# Patient Record
Sex: Male | Born: 1940 | Race: White | Hispanic: No | Marital: Married | Smoking: Never smoker
Health system: Southern US, Community
[De-identification: ages and names within clinical notes are randomized; demographics above are authoritative.]

## PROBLEM LIST (undated history)

## (undated) DIAGNOSIS — I10 Essential (primary) hypertension: Secondary | ICD-10-CM

## (undated) DIAGNOSIS — E669 Obesity, unspecified: Secondary | ICD-10-CM

## (undated) DIAGNOSIS — I2589 Other forms of chronic ischemic heart disease: Secondary | ICD-10-CM

## (undated) DIAGNOSIS — E876 Hypokalemia: Secondary | ICD-10-CM

## (undated) DIAGNOSIS — Z955 Presence of coronary angioplasty implant and graft: Secondary | ICD-10-CM

## (undated) DIAGNOSIS — E119 Type 2 diabetes mellitus without complications: Secondary | ICD-10-CM

## (undated) DIAGNOSIS — I509 Heart failure, unspecified: Secondary | ICD-10-CM

## (undated) DIAGNOSIS — I639 Cerebral infarction, unspecified: Secondary | ICD-10-CM

## (undated) HISTORY — DX: Type 2 diabetes mellitus without complications: E11.9

## (undated) HISTORY — DX: Cerebral infarction, unspecified: I63.9

## (undated) HISTORY — DX: Essential (primary) hypertension: I10

## (undated) HISTORY — DX: Hypokalemia: E87.6

## (undated) HISTORY — DX: Other forms of chronic ischemic heart disease: I25.89

## (undated) HISTORY — DX: Obesity, unspecified: E66.9

## (undated) HISTORY — DX: Presence of coronary angioplasty implant and graft: Z95.5

---

## 1981-04-22 HISTORY — PX: UMBILICAL HERNIA REPAIR: SHX196

## 2008-10-19 ENCOUNTER — Ambulatory Visit: Payer: Self-pay | Admitting: Internal Medicine

## 2008-10-19 ENCOUNTER — Ambulatory Visit: Payer: Self-pay | Admitting: Cardiology

## 2008-10-19 ENCOUNTER — Inpatient Hospital Stay (HOSPITAL_COMMUNITY): Admission: EM | Admit: 2008-10-19 | Discharge: 2008-10-29 | Payer: Self-pay | Admitting: Emergency Medicine

## 2008-10-19 ENCOUNTER — Encounter: Payer: Self-pay | Admitting: Emergency Medicine

## 2008-10-20 ENCOUNTER — Encounter: Payer: Self-pay | Admitting: Cardiology

## 2008-10-21 ENCOUNTER — Ambulatory Visit: Payer: Self-pay | Admitting: Vascular Surgery

## 2008-10-21 ENCOUNTER — Encounter: Payer: Self-pay | Admitting: Internal Medicine

## 2008-10-21 ENCOUNTER — Encounter: Payer: Self-pay | Admitting: Cardiothoracic Surgery

## 2008-10-22 ENCOUNTER — Ambulatory Visit: Payer: Self-pay | Admitting: Cardiothoracic Surgery

## 2008-10-25 ENCOUNTER — Encounter: Payer: Self-pay | Admitting: Internal Medicine

## 2008-10-26 ENCOUNTER — Encounter: Payer: Self-pay | Admitting: Internal Medicine

## 2008-10-27 ENCOUNTER — Encounter: Payer: Self-pay | Admitting: Cardiology

## 2008-11-01 ENCOUNTER — Ambulatory Visit: Payer: Self-pay | Admitting: Cardiology

## 2008-11-01 ENCOUNTER — Ambulatory Visit: Payer: Self-pay | Admitting: Internal Medicine

## 2008-11-01 ENCOUNTER — Encounter (INDEPENDENT_AMBULATORY_CARE_PROVIDER_SITE_OTHER): Payer: Self-pay | Admitting: Cardiology

## 2008-11-01 DIAGNOSIS — E876 Hypokalemia: Secondary | ICD-10-CM

## 2008-11-03 LAB — CONVERTED CEMR LAB
CO2: 28 meq/L (ref 19–32)
Calcium: 9 mg/dL (ref 8.4–10.5)
Glucose, Bld: 146 mg/dL — ABNORMAL HIGH (ref 70–99)
Potassium: 4.1 meq/L (ref 3.5–5.1)
Sodium: 135 meq/L (ref 135–145)

## 2008-11-07 ENCOUNTER — Encounter: Admission: RE | Admit: 2008-11-07 | Discharge: 2009-02-05 | Payer: Self-pay | Admitting: Internal Medicine

## 2008-11-07 ENCOUNTER — Encounter: Payer: Self-pay | Admitting: Internal Medicine

## 2008-11-07 ENCOUNTER — Ambulatory Visit: Payer: Self-pay | Admitting: Cardiology

## 2008-11-12 DIAGNOSIS — E669 Obesity, unspecified: Secondary | ICD-10-CM

## 2008-11-12 DIAGNOSIS — E119 Type 2 diabetes mellitus without complications: Secondary | ICD-10-CM

## 2008-11-12 DIAGNOSIS — I2589 Other forms of chronic ischemic heart disease: Secondary | ICD-10-CM | POA: Insufficient documentation

## 2008-11-12 DIAGNOSIS — Z8679 Personal history of other diseases of the circulatory system: Secondary | ICD-10-CM

## 2008-11-15 ENCOUNTER — Encounter: Payer: Self-pay | Admitting: Internal Medicine

## 2008-11-16 ENCOUNTER — Ambulatory Visit: Payer: Self-pay | Admitting: Cardiology

## 2008-11-16 ENCOUNTER — Encounter: Payer: Self-pay | Admitting: Physician Assistant

## 2008-11-16 DIAGNOSIS — I6350 Cerebral infarction due to unspecified occlusion or stenosis of unspecified cerebral artery: Secondary | ICD-10-CM | POA: Insufficient documentation

## 2008-11-16 DIAGNOSIS — I2510 Atherosclerotic heart disease of native coronary artery without angina pectoris: Secondary | ICD-10-CM | POA: Insufficient documentation

## 2008-11-16 LAB — CONVERTED CEMR LAB
POC INR: 2.6
Prothrombin Time: 19.5 s

## 2008-11-23 ENCOUNTER — Ambulatory Visit: Payer: Self-pay | Admitting: Cardiology

## 2008-11-23 ENCOUNTER — Ambulatory Visit: Payer: Self-pay

## 2008-11-23 ENCOUNTER — Encounter: Payer: Self-pay | Admitting: Internal Medicine

## 2008-11-23 LAB — CONVERTED CEMR LAB: POC INR: 2.8

## 2008-11-30 ENCOUNTER — Encounter: Payer: Self-pay | Admitting: Internal Medicine

## 2008-12-02 ENCOUNTER — Ambulatory Visit: Payer: Self-pay | Admitting: Internal Medicine

## 2008-12-02 LAB — CONVERTED CEMR LAB: POC INR: 2.3

## 2008-12-07 ENCOUNTER — Ambulatory Visit: Payer: Self-pay | Admitting: Internal Medicine

## 2008-12-07 DIAGNOSIS — M109 Gout, unspecified: Secondary | ICD-10-CM | POA: Insufficient documentation

## 2008-12-07 DIAGNOSIS — I5022 Chronic systolic (congestive) heart failure: Secondary | ICD-10-CM | POA: Insufficient documentation

## 2008-12-29 ENCOUNTER — Ambulatory Visit: Payer: Self-pay | Admitting: Internal Medicine

## 2008-12-29 ENCOUNTER — Ambulatory Visit: Payer: Self-pay | Admitting: Cardiovascular Disease

## 2009-01-12 ENCOUNTER — Ambulatory Visit: Payer: Self-pay | Admitting: Internal Medicine

## 2009-01-26 ENCOUNTER — Ambulatory Visit: Payer: Self-pay | Admitting: Internal Medicine

## 2009-02-09 ENCOUNTER — Telehealth: Payer: Self-pay | Admitting: Internal Medicine

## 2009-02-09 ENCOUNTER — Ambulatory Visit: Payer: Self-pay | Admitting: Cardiovascular Disease

## 2009-02-21 ENCOUNTER — Ambulatory Visit: Payer: Self-pay | Admitting: Internal Medicine

## 2009-02-21 DIAGNOSIS — E785 Hyperlipidemia, unspecified: Secondary | ICD-10-CM

## 2009-02-21 DIAGNOSIS — I238 Other current complications following acute myocardial infarction: Secondary | ICD-10-CM

## 2009-03-07 ENCOUNTER — Ambulatory Visit: Payer: Self-pay | Admitting: Cardiology

## 2009-03-08 ENCOUNTER — Encounter: Payer: Self-pay | Admitting: Internal Medicine

## 2009-03-17 ENCOUNTER — Ambulatory Visit: Payer: Self-pay | Admitting: Internal Medicine

## 2009-03-17 LAB — CONVERTED CEMR LAB: POC INR: 2.5

## 2009-04-03 ENCOUNTER — Ambulatory Visit: Payer: Self-pay | Admitting: Cardiovascular Disease

## 2009-04-03 LAB — CONVERTED CEMR LAB: POC INR: 2.2

## 2009-05-01 ENCOUNTER — Ambulatory Visit: Payer: Self-pay | Admitting: Cardiology

## 2009-05-01 LAB — CONVERTED CEMR LAB: POC INR: 2

## 2009-05-29 ENCOUNTER — Ambulatory Visit: Payer: Self-pay | Admitting: Cardiology

## 2009-05-29 ENCOUNTER — Telehealth: Payer: Self-pay | Admitting: Internal Medicine

## 2009-05-29 LAB — CONVERTED CEMR LAB: POC INR: 1.8

## 2009-06-19 ENCOUNTER — Ambulatory Visit: Payer: Self-pay | Admitting: Internal Medicine

## 2009-06-19 LAB — CONVERTED CEMR LAB: POC INR: 2.3

## 2009-07-03 ENCOUNTER — Encounter: Payer: Self-pay | Admitting: Internal Medicine

## 2009-07-11 ENCOUNTER — Ambulatory Visit: Payer: Self-pay | Admitting: Internal Medicine

## 2009-07-11 ENCOUNTER — Ambulatory Visit: Payer: Self-pay | Admitting: Cardiovascular Disease

## 2009-07-24 ENCOUNTER — Telehealth: Payer: Self-pay | Admitting: Internal Medicine

## 2009-08-09 ENCOUNTER — Ambulatory Visit: Payer: Self-pay | Admitting: Internal Medicine

## 2009-08-09 ENCOUNTER — Ambulatory Visit: Payer: Self-pay | Admitting: Cardiology

## 2009-08-11 LAB — CONVERTED CEMR LAB
BUN: 14 mg/dL (ref 6–23)
Calcium: 8.7 mg/dL (ref 8.4–10.5)
GFR calc non Af Amer: 71.07 mL/min (ref >60)
Glucose, Bld: 108 mg/dL — ABNORMAL HIGH (ref 70–99)

## 2009-09-06 ENCOUNTER — Ambulatory Visit: Payer: Self-pay | Admitting: Cardiology

## 2009-10-04 ENCOUNTER — Ambulatory Visit: Payer: Self-pay | Admitting: Cardiology

## 2009-10-18 ENCOUNTER — Ambulatory Visit: Payer: Self-pay | Admitting: Internal Medicine

## 2009-11-08 ENCOUNTER — Ambulatory Visit: Payer: Self-pay | Admitting: Cardiology

## 2009-12-06 ENCOUNTER — Ambulatory Visit: Payer: Self-pay | Admitting: Cardiovascular Disease

## 2009-12-26 ENCOUNTER — Ambulatory Visit: Payer: Self-pay | Admitting: Internal Medicine

## 2010-01-19 ENCOUNTER — Encounter: Payer: Self-pay | Admitting: Internal Medicine

## 2010-01-23 ENCOUNTER — Ambulatory Visit: Payer: Self-pay | Admitting: Cardiology

## 2010-01-23 LAB — CONVERTED CEMR LAB: POC INR: 2.5

## 2010-02-20 ENCOUNTER — Encounter: Payer: Self-pay | Admitting: Cardiovascular Disease

## 2010-02-20 LAB — CONVERTED CEMR LAB: POC INR: 2.7

## 2010-03-19 ENCOUNTER — Ambulatory Visit: Payer: Self-pay | Admitting: Internal Medicine

## 2010-03-21 ENCOUNTER — Telehealth: Payer: Self-pay | Admitting: Internal Medicine

## 2010-04-11 ENCOUNTER — Telehealth: Payer: Self-pay | Admitting: Internal Medicine

## 2010-04-24 ENCOUNTER — Ambulatory Visit: Admission: RE | Admit: 2010-04-24 | Discharge: 2010-04-24 | Payer: Self-pay | Source: Home / Self Care

## 2010-05-22 ENCOUNTER — Ambulatory Visit: Admission: RE | Admit: 2010-05-22 | Discharge: 2010-05-22 | Payer: Self-pay | Source: Home / Self Care

## 2010-05-22 LAB — CONVERTED CEMR LAB: POC INR: 2.9

## 2010-05-22 NOTE — Progress Notes (Signed)
Summary: refill -- lipitor  Phone Note Refill Request Message from:  Patient on July 24, 2009 11:19 AM  Refills Requested: Medication #1:  LIPITOR 40 MG TABS Take one tablet by mouth daily. Send to [facility] not [place] need a 90 supply  Initial call taken by: Judie Grieve,  July 24, 2009 11:21 AM    Prescriptions: LIPITOR 40 MG TABS (ATORVASTATIN CALCIUM) Take one tablet by mouth daily.  #90 x 2   Entered by:   Hardin Negus, RMA   Authorized by:   Dolores Patty, MD, [facility]   Signed by:   Hardin Negus, RMA on 07/24/2009   Method used:   Electronically to        [facility]* (mail-order)             ,          Ph: [phone]       Fax: [phone]   RxID:   [id number]

## 2010-05-22 NOTE — Medication Information (Signed)
Summary: rov/mw  Anticoagulant Therapy  Managed by: Weston Brass, PharmD Referring MD: Gala Romney MD, Reuel Boom PCP: Windle Guard Supervising MD: Shirlee Latch MD, Dalton Indication 1: Cerebrovascular Accident Lab Used: [facility] [place] Site: [address] INR POC 2.5 INR RANGE 2-3  Dietary changes: no    Health status changes: no    Bleeding/hemorrhagic complications: no    Recent/future hospitalizations: no    Any changes in medication regimen? no    Recent/future dental: no  Any missed doses?: no       Is patient compliant with meds? yes       Allergies: 1)  ! * No Latex Allergy 2)  ! * Ivp Dye 3)  ! * No Shellfish Allergy  Anticoagulation Management History:      The patient is taking warfarin and comes in today for a routine follow up visit.  Positive risk factors for bleeding include an age of 34 years or older, history of CVA/TIA, and presence of serious comorbidities.  The bleeding index is 'high risk'.  Positive CHADS2 values include History of CHF, History of Diabetes, and Prior Stroke/CVA/TIA.  Negative CHADS2 values include Age > 21 years old.  Anticoagulation responsible provider: Shirlee Latch MD, Dalton.  INR POC: 2.5.  Cuvette Lot#: [id number].  Exp: 02/2011.    Anticoagulation Management Assessment/Plan:      The patient's current anticoagulation dose is Warfarin sodium 5 mg tabs: Use as directed by Anticoagulation Clinic.  The target INR is 2.0-3.0.  The next INR is due 02/20/2010.  Anticoagulation instructions were given to patient.  Results were reviewed/authorized by Weston Brass, PharmD.  He was notified by Ilean Skill D candidate.         Prior Anticoagulation Instructions: INR is 2.5 Continue taking 1.5 tablets everyday. See you in 4 weeks.  Current Anticoagulation Instructions: INR 2.5  Patient will continue current regimen of 1 1/2 tablets everyday.  Recheck in 4 weeks.

## 2010-05-22 NOTE — Medication Information (Signed)
Summary: rov/mw  Anticoagulant Therapy  Managed by: Weston Brass, PharmD Referring MD: Gala Romney MD, Reuel Boom PCP: Windle Guard Supervising MD: Tenny Craw MD, Gunnar Fusi Indication 1: Cerebrovascular Accident Lab Used: [facility] [place] Site: [address] INR POC 2.5 INR RANGE 2-3  Dietary changes: no    Health status changes: no    Bleeding/hemorrhagic complications: no    Recent/future hospitalizations: no    Any changes in medication regimen? no    Recent/future dental: no  Any missed doses?: no       Is patient compliant with meds? yes       Allergies: 1)  ! * No Latex Allergy 2)  ! * Ivp Dye 3)  ! * No Shellfish Allergy  Anticoagulation Management History:      The patient is taking warfarin and comes in today for a routine follow up visit.  Positive risk factors for bleeding include an age of 65 years or older, history of CVA/TIA, and presence of serious comorbidities.  The bleeding index is 'high risk'.  Positive CHADS2 values include History of CHF, History of Diabetes, and Prior Stroke/CVA/TIA.  Negative CHADS2 values include Age > 8 years old.  Anticoagulation responsible provider: Tenny Craw MD, Gunnar Fusi.  INR POC: 2.5.  Cuvette Lot#: [id number].  Exp: 03/2011.    Anticoagulation Management Assessment/Plan:      The patient's current anticoagulation dose is Warfarin sodium 5 mg tabs: Use as directed by Anticoagulation Clinic.  The target INR is 2.0-3.0.  The next INR is due 04/24/2010.  Anticoagulation instructions were given to patient.  Results were reviewed/authorized by Weston Brass, PharmD.  He was notified by Weston Brass PharmD.         Prior Anticoagulation Instructions: INR 2.7 Continue taking 1.5 tablets everyday. Recheck in 4 weeks.  Current Anticoagulation Instructions: INR 2.5  Continue same dose of 1 1/2 tablets every day.  Recheck INR in 4 weeks.

## 2010-05-22 NOTE — Progress Notes (Signed)
Summary: question re med  Phone Note Call from Patient   Caller: Patient  [phone] Reason for Call: Talk to Nurse Summary of Call: pt has question re medication Initial call taken by: Glynda Jaeger,  March 21, 2010 11:11 AM  Follow-up for Phone Call        pt states he received letter about generic lipitor and request to switch, will send new rx for generic Lipitor Meredith Staggers, RN  March 21, 2010 2:00 PM     New/Updated Medications: LIPITOR 40 MG TABS (ATORVASTATIN CALCIUM) Take 1 tablet by mouth once a day Prescriptions: LIPITOR 40 MG TABS (ATORVASTATIN CALCIUM) Take 1 tablet by mouth once a day  #90 x 3   Entered by:   Meredith Staggers, RN   Authorized by:   Dolores Patty, MD, [facility]   Signed by:   Meredith Staggers, RN on 03/21/2010   Method used:   Electronically to        [facility] (retail)             ,          Ph: [phone]       Fax: [phone]   RxID:   [id number]

## 2010-05-22 NOTE — Medication Information (Signed)
Summary: rov/tm  Anticoagulant Therapy  Managed by: Bethena Midget, RN, BSN Referring MD: Gala Romney MD, Reuel Boom PCP: Windle Guard Supervising MD: Eden Emms MD, Theron Arista Indication 1: Cerebrovascular Accident Lab Used: [facility] Site: [address] INR POC 2.4 INR RANGE 2-3  Dietary changes: no    Health status changes: no    Bleeding/hemorrhagic complications: no    Recent/future hospitalizations: no    Any changes in medication regimen? no    Recent/future dental: no  Any missed doses?: no       Is patient compliant with meds? yes       Current Medications (verified): 1)  Aspirin 81 Mg Tbec (Aspirin) .... Take One Tablet By Mouth Daily 2)  Carvedilol 6.25 Mg Tabs (Carvedilol) .... Take One Tablet By Mouth Twice A Day 3)  Enalapril Maleate 10 Mg Tabs (Enalapril Maleate) .... Take One Tablet By Mouth Twice A Day 4)  Furosemide 20 Mg Tabs (Furosemide) .... Take One Tablet By Mouth Every Other Day. 5)  Spironolactone 25 Mg Tabs (Spironolactone) .... Take 1  Tablet By Mouth Daily 6)  Nitroglycerin 0.4 Mg Subl (Nitroglycerin) .... One Tablet Under Tongue Every 5 Minutes As Needed For Chest Pain---May Repeat Times Three 7)  Warfarin Sodium 5 Mg Tabs (Warfarin Sodium) .... Use As Directed By Anticoagulation Clinic 8)  Metformin Hcl 500 Mg Tabs (Metformin Hcl) .... Take One Tablet By Mouth Once Daily. 9)  Allopurinol 100 Mg Tabs (Allopurinol) .... Take 1 Tablet By Mouth Once A Day 10)  Lipitor 40 Mg Tabs (Atorvastatin Calcium) .... Take One Tablet By Mouth Daily. 11)  Colcrys 0.6 Mg Tabs (Colchicine) .Marland Kitchen.. 1-2 Daily As Needed 12)  Fish Oil 1000 Mg Caps (Omega-3 Fatty Acids) .... 2 Caps Daily  Allergies: 1)  ! * No Latex Allergy 2)  ! * Ivp Dye 3)  ! * No Shellfish Allergy  Anticoagulation Management History:      The patient is taking warfarin and comes in today for a routine follow up visit.  Positive risk factors for bleeding include an age of 68 years or  older, history of CVA/TIA, and presence of serious comorbidities.  The bleeding index is 'high risk'.  Positive CHADS2 values include History of CHF, History of Diabetes, and Prior Stroke/CVA/TIA.  Negative CHADS2 values include Age > 54 years old.  Anticoagulation responsible provider: Eden Emms MD, Theron Arista.  INR POC: 2.4.  Cuvette Lot#: [id number].  Exp: 01/2011.    Anticoagulation Management Assessment/Plan:      The patient's current anticoagulation dose is Warfarin sodium 5 mg tabs: Use as directed by Anticoagulation Clinic.  The target INR is 2.0-3.0.  The next INR is due 01/03/2010.  Anticoagulation instructions were given to patient.  Results were reviewed/authorized by Bethena Midget, RN, BSN.  He was notified by Bethena Midget, RN, BSN.         Prior Anticoagulation Instructions: INR 2.3 Continue 7.5mg s everyday. Recheck in 4 weeks.   Current Anticoagulation Instructions: INR 2.4 Continue 7.5mg s everyday. Recheck in 4 weeks.
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
## 2010-05-22 NOTE — Medication Information (Signed)
Summary: rov/tm  Anticoagulant Therapy  Managed by: Bethena Midget, RN, BSN Referring MD: Gala Romney MD, Reuel Boom PCP: Windle Guard Supervising MD: Bensimhon MD,Daniel Indication 1: Cerebrovascular Accident Lab Used: [facility] [place] Site: [address] INR POC 2.5 INR RANGE 2-3  Dietary changes: no    Health status changes: no    Bleeding/hemorrhagic complications: no    Recent/future hospitalizations: no    Any changes in medication regimen? yes       Details: stopped taking plavix end of july  Recent/future dental: no  Any missed doses?: no       Is patient compliant with meds? yes       Allergies: 1)  ! * No Latex Allergy 2)  ! * Ivp Dye 3)  ! * No Shellfish Allergy  Anticoagulation Management History:      The patient is taking warfarin and comes in today for a routine follow up visit.  Positive risk factors for bleeding include an age of 68 years or older, history of CVA/TIA, and presence of serious comorbidities.  The bleeding index is 'high risk'.  Positive CHADS2 values include History of CHF, History of Diabetes, and Prior Stroke/CVA/TIA.  Negative CHADS2 values include Age > 42 years old.  Anticoagulation responsible provider: Bensimhon MD,Daniel.  INR POC: 2.5.  Cuvette Lot#: [id number].  Exp: 01/2011.    Anticoagulation Management Assessment/Plan:      The patient's current anticoagulation dose is Warfarin sodium 5 mg tabs: Use as directed by Anticoagulation Clinic.  The target INR is 2.0-3.0.  The next INR is due 01/23/2010.  Anticoagulation instructions were given to patient.  Results were reviewed/authorized by Bethena Midget, RN, BSN.         Prior Anticoagulation Instructions: INR 2.4 Continue 7.5mg s everyday. Recheck in 4 weeks.   Current Anticoagulation Instructions: INR is 2.5 Continue taking 1.5 tablets everyday. See you in 4 weeks.

## 2010-05-22 NOTE — Medication Information (Signed)
Summary: rov/eac  Anticoagulant Therapy  Managed by: Elaina Pattee, PharmD PCP: Windle Guard Supervising MD: Myrtis Ser MD, Tinnie Gens Indication 1: Cerebrovascular Accident Lab Used: [facility]  Site: [address] INR POC 1.9 INR RANGE 2-3  Dietary changes: no    Health status changes: no    Bleeding/hemorrhagic complications: no    Recent/future hospitalizations: no    Any changes in medication regimen? no    Recent/future dental: no  Any missed doses?: no       Is patient compliant with meds? yes       Allergies: 1)  ! * No Latex Allergy 2)  ! * Ivp Dye 3)  ! * No Shellfish Allergy  Anticoagulation Management History:      The patient is taking warfarin and comes in today for a routine follow up visit.  Positive risk factors for bleeding include an age of 68 years or older, history of CVA/TIA, and presence of serious comorbidities.  The bleeding index is 'high risk'.  Positive CHADS2 values include History of CHF, History of Diabetes, and Prior Stroke/CVA/TIA.  Negative CHADS2 values include Age > 68 years old.  Anticoagulation responsible provider: Myrtis Ser MD, Tinnie Gens.  INR POC: 1.9.  Cuvette Lot#: [id number].  Exp: 11/2010.    Anticoagulation Management Assessment/Plan:      The patient's current anticoagulation dose is Warfarin sodium 5 mg tabs: Use as directed by Anticoagulation Clinic.  The target INR is 2.0-3.0.  The next INR is due 10/18/2009.  Anticoagulation instructions were given to patient.  Results were reviewed/authorized by Elaina Pattee, PharmD.  He was notified by Elaina Pattee, PharmD.         Prior Anticoagulation Instructions: INR 2.2  Continue taking 1 tablet on Friday and 1.5 all other days.  Return to clinic in 4 weeks.    Current Anticoagulation Instructions: INR 1.9. Take 2 tablets today, then take 1.5 tablets daily.  Recheck in 2 weeks.

## 2010-05-22 NOTE — Medication Information (Signed)
Summary: Marcus Ramos  Anticoagulant Therapy  Managed by: Cloyde Reams, RN, BSN PCP: Windle Guard Supervising MD: Jens Som MD, Arlys John Indication 1: Cerebrovascular Accident Lab Used: [facility] [place] Site: [address] INR POC 1.8 INR RANGE 2-3  Dietary changes: yes       Details: Incr intake vit K, salads  Health status changes: no    Bleeding/hemorrhagic complications: no    Recent/future hospitalizations: no    Any changes in medication regimen? no    Recent/future dental: no  Any missed doses?: no       Is patient compliant with meds? yes       Allergies: 1)  ! * No Latex Allergy 2)  ! * Ivp Dye 3)  ! * No Shellfish Allergy  Anticoagulation Management History:      The patient is taking warfarin and comes in today for a routine follow up visit.  Positive risk factors for bleeding include an age of 51 years or older, history of CVA/TIA, and presence of serious comorbidities.  The bleeding index is 'high risk'.  Positive CHADS2 values include History of CHF, History of Diabetes, and Prior Stroke/CVA/TIA.  Negative CHADS2 values include Age > 77 years old.  Anticoagulation responsible provider: Jens Som MD, Arlys John.  INR POC: 1.8.  Cuvette Lot#: [id number].  Exp: 07/2010.    Anticoagulation Management Assessment/Plan:      The patient's current anticoagulation dose is Warfarin sodium 5 mg tabs: Use as directed by Anticoagulation Clinic.  The target INR is 2.0-3.0.  The next INR is due 06/19/2009.  Anticoagulation instructions were given to patient.  Results were reviewed/authorized by Cloyde Reams, RN, BSN.  He was notified by Cloyde Reams RN.         Prior Anticoagulation Instructions: INR 2.0  Take 1.5 tablet today then resume same dosage 1.5 tablets daily except 1 tablet on Mondays and Fridays.   Recheck in 4 weeks.    Current Anticoagulation Instructions: INR 1.8  Start taking 1.5 tablets daily except 1 on Fridays.  Recheck in 3 weeks.

## 2010-05-22 NOTE — Medication Information (Signed)
Summary: rov/cb  Anticoagulant Therapy  Managed by: Eda Keys, PharmD PCP: Windle Guard Supervising MD: Jens Som MD, Arlys John Indication 1: Cerebrovascular Accident Lab Used: [facility] [place] Site: [address] INR POC 2.2 INR RANGE 2-3  Dietary changes: no    Health status changes: no    Bleeding/hemorrhagic complications: no    Recent/future hospitalizations: no    Any changes in medication regimen? no    Recent/future dental: no  Any missed doses?: no       Is patient compliant with meds? yes       Allergies: 1)  ! * No Latex Allergy 2)  ! * Ivp Dye 3)  ! * No Shellfish Allergy  Anticoagulation Management History:      The patient is taking warfarin and comes in today for a routine follow up visit.  Positive risk factors for bleeding include an age of 68 years or older, history of CVA/TIA, and presence of serious comorbidities.  The bleeding index is 'high risk'.  Positive CHADS2 values include History of CHF, History of Diabetes, and Prior Stroke/CVA/TIA.  Negative CHADS2 values include Age > 68 years old.  Anticoagulation responsible provider: Jens Som MD, Arlys John.  INR POC: 2.2.  Cuvette Lot#: [id number].  Exp: 11/2010.    Anticoagulation Management Assessment/Plan:      The patient's current anticoagulation dose is Warfarin sodium 5 mg tabs: Use as directed by Anticoagulation Clinic.  The target INR is 2.0-3.0.  The next INR is due 10/04/2009.  Anticoagulation instructions were given to patient.  Results were reviewed/authorized by Eda Keys, PharmD.  He was notified by Eda Keys.         Prior Anticoagulation Instructions: INR 1.8. Take 2 tablets today, then take 1.5 tablets daily except 1 tablet on Fridays.  Current Anticoagulation Instructions: INR 2.2  Continue taking 1 tablet on Friday and 1.5 all other days.  Return to clinic in 4 weeks.

## 2010-05-22 NOTE — Medication Information (Signed)
Summary: rov/tm  Anticoagulant Therapy  Managed by: Cloyde Reams, RN, BSN PCP: Windle Guard Supervising MD: Jens Som MD, Arlys John Indication 1: Cerebrovascular Accident Lab Used: [facility] [place] Site: [address] INR POC 2.0 INR RANGE 2-3  Dietary changes: no    Health status changes: no    Bleeding/hemorrhagic complications: no    Recent/future hospitalizations: no    Any changes in medication regimen? no    Recent/future dental: no  Any missed doses?: no       Is patient compliant with meds? yes       Allergies (verified): 1)  ! * No Latex Allergy 2)  ! * Ivp Dye 3)  ! * No Shellfish Allergy  Anticoagulation Management History:      The patient is taking warfarin and comes in today for a routine follow up visit.  Positive risk factors for bleeding include an age of 85 years or older, history of CVA/TIA, and presence of serious comorbidities.  The bleeding index is 'high risk'.  Positive CHADS2 values include History of CHF, History of Diabetes, and Prior Stroke/CVA/TIA.  Negative CHADS2 values include Age > 64 years old.  Anticoagulation responsible provider: Jens Som MD, Arlys John.  INR POC: 2.0.  Cuvette Lot#: [id number].  Exp: 05/2010.    Anticoagulation Management Assessment/Plan:      The patient's current anticoagulation dose is Warfarin sodium 5 mg tabs: Use as directed by Anticoagulation Clinic.  The target INR is 2.0-3.0.  The next INR is due 05/29/2009.  Anticoagulation instructions were given to patient.  Results were reviewed/authorized by Cloyde Reams, RN, BSN.  He was notified by Cloyde Reams RN.         Prior Anticoagulation Instructions: INR 2.2 Continue 1.5 tablets everyday except 1 tablet on Mondays and Fridays. Recheck in 4 weeks.    Current Anticoagulation Instructions: INR 2.0  Take 1.5 tablet today then resume same dosage 1.5 tablets daily except 1 tablet on Mondays and Fridays.   Recheck in 4 weeks.

## 2010-05-22 NOTE — Progress Notes (Signed)
Summary: lipid results  Phone Note Call from Patient   Caller: Patient Call For: Bensimhon Summary of Call: Pt was seen in coumadin clinic today.  Inquiring on cholestrol lab results done a couple months ago at Dr Jeannetta Nap office.  Per Dr Gala Romney at last OV in 11/09 pt was to have lipids checked at Dr Jeannetta Nap office and results sent here.  Results scanned into EMR, but pt states he never heard from anyone reguarding those results.  Please advise.  Thanks. Initial call taken by: Cloyde Reams RN,  May 29, 2009 8:30 AM  Follow-up for Phone Call        pts labs were scanned into EMR as signed by Dr Tenny Craw so Dr Gala Romney never saw them, they have been forwarded to his desktop for review, Left message to call back Meredith Staggers, RN  May 29, 2009 1:00 PM   Additional Follow-up for Phone Call Additional follow up Details #1::        Dr Leory Plowman reviewed labs, see labs Meredith Staggers, RN  June 01, 2009 6:23 PM

## 2010-05-22 NOTE — Medication Information (Signed)
Summary: Marcus Ramos  Anticoagulant Therapy  Managed by: Elaina Pattee, PharmD PCP: Windle Guard Supervising MD: Jens Som MD, Arlys John Indication 1: Cerebrovascular Accident Lab Used: [facility] [place] Site: [address] INR POC 1.8 INR RANGE 2-3  Dietary changes: yes       Details: Has eaten more salads.  Health status changes: yes       Details: Lasix is every other day. Pt notices 3 lbs wt gain on days he does not take it, but it decreases next day with dose.  Bleeding/hemorrhagic complications: no    Recent/future hospitalizations: no    Any changes in medication regimen? no    Recent/future dental: no  Any missed doses?: no       Is patient compliant with meds? yes       Current Medications (verified): 1)  Aspirin 81 Mg Tbec (Aspirin) .... Take One Tablet By Mouth Daily 2)  Plavix 75 Mg Tabs (Clopidogrel Bisulfate) .... Take One Tablet By Mouth Daily 3)  Carvedilol 6.25 Mg Tabs (Carvedilol) .... Take One Tablet By Mouth Twice A Day 4)  Enalapril Maleate 10 Mg Tabs (Enalapril Maleate) .... Take One Tablet By Mouth Twice A Day 5)  Furosemide 20 Mg Tabs (Furosemide) .... Take One Tablet By Mouth Every Other Day. 6)  Spironolactone 25 Mg Tabs (Spironolactone) .... Take 1  Tablet By Mouth Daily 7)  Nitroglycerin 0.4 Mg Subl (Nitroglycerin) .... One Tablet Under Tongue Every 5 Minutes As Needed For Chest Pain---May Repeat Times Three 8)  Warfarin Sodium 5 Mg Tabs (Warfarin Sodium) .... Use As Directed By Anticoagulation Clinic 9)  Metformin Hcl 500 Mg Tabs (Metformin Hcl) .... Take One Tablet By Mouth Once Daily. 10)  Allopurinol 100 Mg Tabs (Allopurinol) .... Take 1 Tablet By Mouth Once A Day 11)  Lipitor 40 Mg Tabs (Atorvastatin Calcium) .... Take One Tablet By Mouth Daily. 12)  Colcrys 0.6 Mg Tabs (Colchicine) .Marland Kitchen.. 1-2 Daily As Needed 13)  Fish Oil 1000 Mg Caps (Omega-3 Fatty Acids) .... 2 Caps Daily  Allergies: 1)  ! * No Latex Allergy 2)  ! * Ivp Dye 3)   ! * No Shellfish Allergy  Anticoagulation Management History:      The patient is taking warfarin and comes in today for a routine follow up visit.  Positive risk factors for bleeding include an age of 68 years or older, history of CVA/TIA, and presence of serious comorbidities.  The bleeding index is 'high risk'.  Positive CHADS2 values include History of CHF, History of Diabetes, and Prior Stroke/CVA/TIA.  Negative CHADS2 values include Age > 44 years old.  Anticoagulation responsible provider: Jens Som MD, Arlys John.  INR POC: 1.8.  Cuvette Lot#: [id number].  Exp: 08/2010.    Anticoagulation Management Assessment/Plan:      The patient's current anticoagulation dose is Warfarin sodium 5 mg tabs: Use as directed by Anticoagulation Clinic.  The target INR is 2.0-3.0.  The next INR is due 09/06/2009.  Anticoagulation instructions were given to patient.  Results were reviewed/authorized by Elaina Pattee, PharmD.  He was notified by Elaina Pattee, PharmD.         Prior Anticoagulation Instructions: INR 2.2  Continue on same dosage 1.5 tablets daily except 1 tablet on Fridays.  Recheck in 4 weeks.    Current Anticoagulation Instructions: INR 1.8. Take 2 tablets today, then take 1.5 tablets daily except 1 tablet on Fridays.

## 2010-05-22 NOTE — Medication Information (Signed)
Summary: Marcus Ramos  Anticoagulant Therapy  Managed by: Bethena Midget, RN, BSN PCP: Windle Guard Supervising MD: Gala Romney MD, Reuel Boom Indication 1: Cerebrovascular Accident Lab Used: [facility] [place] Site: [address] INR POC 2.4 INR RANGE 2-3  Dietary changes: no    Health status changes: no    Bleeding/hemorrhagic complications: no    Recent/future hospitalizations: no    Any changes in medication regimen? no    Recent/future dental: no  Any missed doses?: no       Is patient compliant with meds? yes       Allergies: 1)  ! * No Latex Allergy 2)  ! * Ivp Dye 3)  ! * No Shellfish Allergy  Anticoagulation Management History:      The patient is taking warfarin and comes in today for a routine follow up visit.  Positive risk factors for bleeding include an age of 29 years or older, history of CVA/TIA, and presence of serious comorbidities.  The bleeding index is 'high risk'.  Positive CHADS2 values include History of CHF, History of Diabetes, and Prior Stroke/CVA/TIA.  Negative CHADS2 values include Age > 63 years old.  Anticoagulation responsible provider: Bensimhon MD, Reuel Boom.  INR POC: 2.4.  Cuvette Lot#: [id number].  Exp: 12/2010.    Anticoagulation Management Assessment/Plan:      The patient's current anticoagulation dose is Warfarin sodium 5 mg tabs: Use as directed by Anticoagulation Clinic.  The target INR is 2.0-3.0.  The next INR is due 11/08/2009.  Anticoagulation instructions were given to patient.  Results were reviewed/authorized by Bethena Midget, RN, BSN.  He was notified by Bethena Midget, RN, BSN.         Prior Anticoagulation Instructions: INR 1.9. Take 2 tablets today, then take 1.5 tablets daily.  Recheck in 2 weeks.  Current Anticoagulation Instructions: INR 2.4 Continue 1 1/2 pills everyday. Recheck in 3 weeks.

## 2010-05-22 NOTE — Medication Information (Signed)
Summary: rov/tm  Anticoagulant Therapy  Managed by: Bethena Midget, RN, BSN PCP: Windle Guard Supervising MD: Myrtis Ser MD, Tinnie Gens Indication 1: Cerebrovascular Accident Lab Used: [facility] [place] Site: [address] INR POC 2.3 INR RANGE 2-3  Dietary changes: no    Health status changes: no    Bleeding/hemorrhagic complications: no    Recent/future hospitalizations: no    Any changes in medication regimen? no    Recent/future dental: no  Any missed doses?: no       Is patient compliant with meds? yes       Allergies: 1)  ! * No Latex Allergy 2)  ! * Ivp Dye 3)  ! * No Shellfish Allergy  Anticoagulation Management History:      The patient is taking warfarin and comes in today for a routine follow up visit.  Positive risk factors for bleeding include an age of 68 years or older, history of CVA/TIA, and presence of serious comorbidities.  The bleeding index is 'high risk'.  Positive CHADS2 values include History of CHF, History of Diabetes, and Prior Stroke/CVA/TIA.  Negative CHADS2 values include Age > 36 years old.  Anticoagulation responsible provider: Myrtis Ser MD, Tinnie Gens.  INR POC: 2.3.  Cuvette Lot#: [id number].  Exp: 01/2011.    Anticoagulation Management Assessment/Plan:      The patient's current anticoagulation dose is Warfarin sodium 5 mg tabs: Use as directed by Anticoagulation Clinic.  The target INR is 2.0-3.0.  The next INR is due 12/06/2009.  Anticoagulation instructions were given to patient.  Results were reviewed/authorized by Bethena Midget, RN, BSN.  He was notified by Bethena Midget, RN, BSN.         Prior Anticoagulation Instructions: INR 2.4 Continue 1 1/2 pills everyday. Recheck in 3 weeks.   Current Anticoagulation Instructions: INR 2.3 Continue 7.5mg s everyday. Recheck in 4 weeks.

## 2010-05-22 NOTE — Medication Information (Addendum)
Summary: Coumadin Clinic  Anticoagulant Therapy  Managed by: Weston Brass, PharmD Referring MD: Gala Romney MD, Reuel Boom PCP: Windle Guard Supervising MD: Nahser Indication 1: Cerebrovascular Accident Lab Used: [facility] [place] Site: [address] INR POC 2.7 INR RANGE 2-3  Dietary changes: no    Health status changes: no    Bleeding/hemorrhagic complications: no    Recent/future hospitalizations: no    Any changes in medication regimen? no    Recent/future dental: no  Any missed doses?: no       Is patient compliant with meds? yes       Allergies: 1)  ! * No Latex Allergy 2)  ! * Ivp Dye 3)  ! * No Shellfish Allergy  Anticoagulation Management History:      The patient is taking warfarin and comes in today for a routine follow up visit.  Positive risk factors for bleeding include an age of 68 years or older, history of CVA/TIA, and presence of serious comorbidities.  The bleeding index is 'high risk'.  Positive CHADS2 values include History of CHF, History of Diabetes, and Prior Stroke/CVA/TIA.  Negative CHADS2 values include Age > 84 years old.  Anticoagulation responsible Mattilyn Crites: Nahser.  INR POC: 2.7.  Cuvette Lot#: [id number].  Exp: 02/2011.    Anticoagulation Management Assessment/Plan:      The patient's current anticoagulation dose is Warfarin sodium 5 mg tabs: Use as directed by Anticoagulation Clinic.  The target INR is 2.0-3.0.  The next INR is due 03/20/2010.  Anticoagulation instructions were given to patient.  Results were reviewed/authorized by Weston Brass, PharmD.         Prior Anticoagulation Instructions: INR 2.5  Patient will continue current regimen of 1 1/2 tablets everyday.  Recheck in 4 weeks.   Current Anticoagulation Instructions: INR 2.7 Continue taking 1.5 tablets everyday. Recheck in 4 weeks.
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
## 2010-05-22 NOTE — Medication Information (Signed)
Summary: rov/tm  Anticoagulant Therapy  Managed by: Cloyde Reams, RN, BSN PCP: Windle Guard Supervising MD: Eden Emms MD, Theron Arista Indication 1: Cerebrovascular Accident Lab Used: [facility] [place] Site: [address] INR POC 2.2 INR RANGE 2-3  Dietary changes: no    Health status changes: no    Bleeding/hemorrhagic complications: no    Recent/future hospitalizations: no    Any changes in medication regimen? yes       Details: Started on cholchine yesterday off allopurinol at present.    Recent/future dental: no  Any missed doses?: no       Is patient compliant with meds? yes       Allergies: 1)  ! * No Latex Allergy 2)  ! * Ivp Dye 3)  ! * No Shellfish Allergy  Anticoagulation Management History:      The patient is taking warfarin and comes in today for a routine follow up visit.  Positive risk factors for bleeding include an age of 62 years or older, history of CVA/TIA, and presence of serious comorbidities.  The bleeding index is 'high risk'.  Positive CHADS2 values include History of CHF, History of Diabetes, and Prior Stroke/CVA/TIA.  Negative CHADS2 values include Age > 26 years old.  Anticoagulation responsible provider: Eden Emms MD, Theron Arista.  INR POC: 2.2.  Cuvette Lot#: [id number].  Exp: 08/2010.    Anticoagulation Management Assessment/Plan:      The patient's current anticoagulation dose is Warfarin sodium 5 mg tabs: Use as directed by Anticoagulation Clinic.  The target INR is 2.0-3.0.  The next INR is due 08/09/2009.  Anticoagulation instructions were given to patient.  Results were reviewed/authorized by Cloyde Reams, RN, BSN.  He was notified by Cloyde Reams RN.         Prior Anticoagulation Instructions: INR 2.3 Continue 1.5 tablets everyday except 1 tablet on Fridays. Recheck in 3 weeks.   Current Anticoagulation Instructions: INR 2.2  Continue on same dosage 1.5 tablets daily except 1 tablet on Fridays.  Recheck in 4 weeks.

## 2010-05-22 NOTE — Medication Information (Signed)
Summary: Marcus Ramos  Anticoagulant Therapy  Managed by: Bethena Midget, RN, BSN PCP: Windle Guard Supervising MD: Tenny Craw MD, Gunnar Fusi Indication 1: Cerebrovascular Accident Lab Used: [facility] [place] Site: [address] INR POC 2.3 INR RANGE 2-3  Dietary changes: no    Health status changes: no    Bleeding/hemorrhagic complications: no    Recent/future hospitalizations: no    Any changes in medication regimen? yes       Details: Lipitor added 2 weeks ago 40mg s   Recent/future dental: no  Any missed doses?: no       Is patient compliant with meds? yes       Allergies: 1)  ! * No Latex Allergy 2)  ! * Ivp Dye 3)  ! * No Shellfish Allergy  Anticoagulation Management History:      The patient is taking warfarin and comes in today for a routine follow up visit.  Positive risk factors for bleeding include an age of 4 years or older, history of CVA/TIA, and presence of serious comorbidities.  The bleeding index is 'high risk'.  Positive CHADS2 values include History of CHF, History of Diabetes, and Prior Stroke/CVA/TIA.  Negative CHADS2 values include Age > 54 years old.  Anticoagulation responsible provider: Tenny Craw MD, Gunnar Fusi.  INR POC: 2.3.  Cuvette Lot#: [id number].  Exp: 08/2010.    Anticoagulation Management Assessment/Plan:      The patient's current anticoagulation dose is Warfarin sodium 5 mg tabs: Use as directed by Anticoagulation Clinic.  The target INR is 2.0-3.0.  The next INR is due 07/11/2009.  Anticoagulation instructions were given to patient.  Results were reviewed/authorized by Bethena Midget, RN, BSN.  He was notified by Bethena Midget, RN, BSN.         Prior Anticoagulation Instructions: INR 1.8  Start taking 1.5 tablets daily except 1 on Fridays.  Recheck in 3 weeks.    Current Anticoagulation Instructions: INR 2.3 Continue 1.5 tablets everyday except 1 tablet on Fridays. Recheck in 3 weeks.

## 2010-05-24 NOTE — Medication Information (Signed)
Summary: rov/sp  Anticoagulant Therapy  Managed by: Weston Brass, PharmD Referring MD: Gala Romney MD, Reuel Boom PCP: Windle Guard Supervising MD: Eden Emms MD, Theron Arista Indication 1: Cerebrovascular Accident Lab Used: [facility] [place] Site: [address] INR POC 2.7 INR RANGE 2-3  Dietary changes: no    Health status changes: no    Bleeding/hemorrhagic complications: no    Recent/future hospitalizations: no    Any changes in medication regimen? no    Recent/future dental: no  Any missed doses?: no       Is patient compliant with meds? yes       Allergies: 1)  ! * No Latex Allergy 2)  ! * Ivp Dye 3)  ! * No Shellfish Allergy  Anticoagulation Management History:      The patient is taking warfarin and comes in today for a routine follow up visit.  Positive risk factors for bleeding include an age of 68 years or older, history of CVA/TIA, and presence of serious comorbidities.  The bleeding index is 'high risk'.  Positive CHADS2 values include History of CHF, History of Diabetes, and Prior Stroke/CVA/TIA.  Negative CHADS2 values include Age > 68 years old.  Anticoagulation responsible provider: Eden Emms MD, Theron Arista.  INR POC: 2.7.  Cuvette Lot#: [id number].  Exp: 05/2011.    Anticoagulation Management Assessment/Plan:      The patient's current anticoagulation dose is Warfarin sodium 5 mg tabs: Use as directed by Anticoagulation Clinic.  The target INR is 2.0-3.0.  The next INR is due 05/22/2010.  Anticoagulation instructions were given to patient.  Results were reviewed/authorized by Weston Brass, PharmD.  He was notified by Weston Brass PharmD.         Prior Anticoagulation Instructions: INR 2.5  Continue same dose of 1 1/2 tablets every day.  Recheck INR in 4 weeks.   Current Anticoagulation Instructions: INR 2.7  Continue same dose of 1 1/2 tablets every day.  Recheck INR in 4 weeks.

## 2010-05-24 NOTE — Progress Notes (Signed)
Summary: refill meds  Phone Note Refill Request Call back at Home Phone [phone] Message from:  Patient on April 11, 2010 8:30 AM  Refills Requested: Medication #1:  WARFARIN SODIUM 5 MG TABS Use as directed by Anticoagulation Clinic [facility] 90 day supply with 3 refill.   Method Requested: Fax to Mail Away Pharmacy Initial call taken by: Lorne Skeens,  April 11, 2010 8:32 AM    Prescriptions: WARFARIN SODIUM 5 MG TABS (WARFARIN SODIUM) Use as directed by Anticoagulation Clinic  #150 x 3   Entered by:   Bethena Midget, RN, BSN   Authorized by:   Dolores Patty, MD, [facility]   Signed by:   Bethena Midget, RN, BSN on 04/11/2010   Method used:   Electronically to        [facility]* (retail)             ,          Ph: [phone]       Fax: [phone]   RxID:   [id number]

## 2010-05-30 NOTE — Medication Information (Signed)
Summary: rov/sp  Anticoagulant Therapy  Managed by: Weston Brass, PharmD Referring MD: Gala Romney MD, Reuel Boom PCP: Windle Guard Supervising MD: Shirlee Latch MD, Ramonda Galyon Indication 1: Cerebrovascular Accident Lab Used: [facility] [place] Site: [address] INR POC 2.9 INR RANGE 2-3  Dietary changes: yes       Details: Less greens than usual   Health status changes: no    Bleeding/hemorrhagic complications: no    Recent/future hospitalizations: no    Any changes in medication regimen? no    Recent/future dental: no  Any missed doses?: no       Is patient compliant with meds? yes       Allergies: 1)  ! * No Latex Allergy 2)  ! * Ivp Dye 3)  ! * No Shellfish Allergy  Anticoagulation Management History:      The patient is taking warfarin and comes in today for a routine follow up visit.  Positive risk factors for bleeding include an age of 13 years or older, history of CVA/TIA, and presence of serious comorbidities.  The bleeding index is 'high risk'.  Positive CHADS2 values include History of CHF, History of Diabetes, and Prior Stroke/CVA/TIA.  Negative CHADS2 values include Age > 23 years old.  Anticoagulation responsible provider: Shirlee Latch MD, Jesenya Bowditch.  INR POC: 2.9.  Cuvette Lot#: [id number].  Exp: 04/2011.    Anticoagulation Management Assessment/Plan:      The patient's current anticoagulation dose is Warfarin sodium 5 mg tabs: Use as directed by Anticoagulation Clinic.  The target INR is 2.0-3.0.  The next INR is due 06/19/2010.  Anticoagulation instructions were given to patient.  Results were reviewed/authorized by Weston Brass, PharmD.  He was notified by Stephannie Peters, PharmD Candidate .         Prior Anticoagulation Instructions: INR 2.7  Continue same dose of 1 1/2 tablets every day.  Recheck INR in 4 weeks.   Current Anticoagulation Instructions: INR 2.9  Coumadin 5 mg tablets - Continue 1.5 tablets every day

## 2010-06-18 ENCOUNTER — Encounter: Payer: Self-pay | Admitting: Internal Medicine

## 2010-06-18 DIAGNOSIS — I238 Other current complications following acute myocardial infarction: Secondary | ICD-10-CM

## 2010-06-18 DIAGNOSIS — I6350 Cerebral infarction due to unspecified occlusion or stenosis of unspecified cerebral artery: Secondary | ICD-10-CM

## 2010-06-19 ENCOUNTER — Encounter (INDEPENDENT_AMBULATORY_CARE_PROVIDER_SITE_OTHER): Payer: Medicare Other

## 2010-06-19 ENCOUNTER — Encounter: Payer: Self-pay | Admitting: Cardiovascular Disease

## 2010-06-19 DIAGNOSIS — Z7901 Long term (current) use of anticoagulants: Secondary | ICD-10-CM

## 2010-06-19 DIAGNOSIS — I6789 Other cerebrovascular disease: Secondary | ICD-10-CM

## 2010-06-19 LAB — CONVERTED CEMR LAB: POC INR: 2.7

## 2010-06-28 ENCOUNTER — Encounter (INDEPENDENT_AMBULATORY_CARE_PROVIDER_SITE_OTHER): Payer: Self-pay | Admitting: *Deleted

## 2010-06-28 NOTE — Medication Information (Signed)
Summary: rov/sp  Anticoagulant Therapy  Managed by: Weston Brass, PharmD Referring MD: Gala Romney MD, Reuel Boom PCP: Windle Guard Supervising MD: Eden Emms MD, Theron Arista Indication 1: Cerebrovascular Accident Lab Used: [facility] [place] Site: [address] INR POC 2.7 INR RANGE 2-3  Dietary changes: no    Health status changes: no    Bleeding/hemorrhagic complications: no    Recent/future hospitalizations: no    Any changes in medication regimen? no    Recent/future dental: no  Any missed doses?: no       Is patient compliant with meds? yes       Allergies: 1)  ! * No Latex Allergy 2)  ! * Ivp Dye 3)  ! * No Shellfish Allergy  Anticoagulation Management History:      The patient is taking warfarin and comes in today for a routine follow up visit.  Positive risk factors for bleeding include an age of 63 years or older, history of CVA/TIA, and presence of serious comorbidities.  The bleeding index is 'high risk'.  Positive CHADS2 values include History of CHF, History of Diabetes, and Prior Stroke/CVA/TIA.  Negative CHADS2 values include Age > 31 years old.  Anticoagulation responsible provider: Eden Emms MD, Theron Arista.  INR POC: 2.7.  Cuvette Lot#: [id number].  Exp: 04/2011.    Anticoagulation Management Assessment/Plan:      The patient's current anticoagulation dose is Warfarin sodium 5 mg tabs: Use as directed by Anticoagulation Clinic.  The target INR is 2.0-3.0.  The next INR is due 07/17/2010.  Anticoagulation instructions were given to patient.  Results were reviewed/authorized by Weston Brass, PharmD.  He was notified by Margot Chimes PharmD Candidate.         Prior Anticoagulation Instructions: INR 2.9  Coumadin 5 mg tablets - Continue 1.5 tablets every day   Current Anticoagulation Instructions: INR 2.7  Continue to take 1 1/2 tablets daily.  Recheck INR in 4 weeks.

## 2010-06-29 ENCOUNTER — Ambulatory Visit: Payer: Self-pay | Admitting: Internal Medicine

## 2010-07-03 NOTE — Letter (Signed)
Summary: Appointment - Reschedule  [facility], Main Office  [address]   [place], [place] [address]   Phone: [phone]  Fax: [phone]     June 28, 2010 MRN: [id number]   Marcus Ramos [address] [place], [place]  [address]   Dear [name],   Due to a change in our office schedule, your appointment on March 29,2012 at 9:45 must be changed.  It is very important that we reach you to reschedule this appointment. We look forward to participating in your health care needs. Please contact us at the number listed above at your earliest convenience to reschedule this appointment.     Sincerely, Control and instrumentation engineer

## 2010-07-07 ENCOUNTER — Encounter: Payer: Self-pay | Admitting: Internal Medicine

## 2010-07-19 ENCOUNTER — Ambulatory Visit (INDEPENDENT_AMBULATORY_CARE_PROVIDER_SITE_OTHER): Payer: Medicare Other | Admitting: *Deleted

## 2010-07-19 ENCOUNTER — Ambulatory Visit: Payer: Self-pay | Admitting: Internal Medicine

## 2010-07-19 DIAGNOSIS — I238 Other current complications following acute myocardial infarction: Secondary | ICD-10-CM

## 2010-07-19 DIAGNOSIS — I6350 Cerebral infarction due to unspecified occlusion or stenosis of unspecified cerebral artery: Secondary | ICD-10-CM

## 2010-07-19 DIAGNOSIS — Z7901 Long term (current) use of anticoagulants: Secondary | ICD-10-CM

## 2010-07-19 NOTE — Patient Instructions (Signed)
Continue on same dosage 1.5 tablets daily.  Recheck in 4 weeks.  

## 2010-07-29 LAB — BASIC METABOLIC PANEL
BUN: 10 mg/dL (ref 6–23)
BUN: 11 mg/dL (ref 6–23)
BUN: 15 mg/dL (ref 6–23)
BUN: 8 mg/dL (ref 6–23)
CO2: 24 mEq/L (ref 19–32)
CO2: 26 mEq/L (ref 19–32)
CO2: 27 mEq/L (ref 19–32)
CO2: 27 mEq/L (ref 19–32)
Calcium: 8.3 mg/dL — ABNORMAL LOW (ref 8.4–10.5)
Calcium: 8.5 mg/dL (ref 8.4–10.5)
Chloride: 100 mEq/L (ref 96–112)
Chloride: 100 mEq/L (ref 96–112)
Chloride: 103 mEq/L (ref 96–112)
Chloride: 106 mEq/L (ref 96–112)
Chloride: 99 mEq/L (ref 96–112)
Creatinine, Ser: 0.99 mg/dL (ref 0.4–1.5)
Creatinine, Ser: 1.04 mg/dL (ref 0.4–1.5)
Creatinine, Ser: 1.04 mg/dL (ref 0.4–1.5)
Creatinine, Ser: 1.08 mg/dL (ref 0.4–1.5)
GFR calc Af Amer: >60 mL/min (ref >60)
GFR calc Af Amer: >60 mL/min (ref >60)
GFR calc Af Amer: >60 mL/min (ref >60)
GFR calc Af Amer: >60 mL/min (ref >60)
GFR calc non Af Amer: >60 mL/min (ref >60)
Glucose, Bld: 212 mg/dL — ABNORMAL HIGH (ref 70–99)
Potassium: 3.5 mEq/L (ref 3.5–5.1)
Potassium: 3.5 mEq/L (ref 3.5–5.1)
Potassium: 3.7 mEq/L (ref 3.5–5.1)
Potassium: 3.8 mEq/L (ref 3.5–5.1)
Potassium: 3.9 mEq/L (ref 3.5–5.1)
Potassium: 4.3 mEq/L (ref 3.5–5.1)
Sodium: 132 mEq/L — ABNORMAL LOW (ref 135–145)
Sodium: 136 mEq/L (ref 135–145)

## 2010-07-29 LAB — GLUCOSE, CAPILLARY
Glucose-Capillary: 102 mg/dL — ABNORMAL HIGH (ref 70–99)
Glucose-Capillary: 105 mg/dL — ABNORMAL HIGH (ref 70–99)
Glucose-Capillary: 108 mg/dL — ABNORMAL HIGH (ref 70–99)
Glucose-Capillary: 124 mg/dL — ABNORMAL HIGH (ref 70–99)
Glucose-Capillary: 127 mg/dL — ABNORMAL HIGH (ref 70–99)
Glucose-Capillary: 143 mg/dL — ABNORMAL HIGH (ref 70–99)
Glucose-Capillary: 150 mg/dL — ABNORMAL HIGH (ref 70–99)
Glucose-Capillary: 151 mg/dL — ABNORMAL HIGH (ref 70–99)
Glucose-Capillary: 151 mg/dL — ABNORMAL HIGH (ref 70–99)
Glucose-Capillary: 151 mg/dL — ABNORMAL HIGH (ref 70–99)
Glucose-Capillary: 152 mg/dL — ABNORMAL HIGH (ref 70–99)
Glucose-Capillary: 155 mg/dL — ABNORMAL HIGH (ref 70–99)
Glucose-Capillary: 158 mg/dL — ABNORMAL HIGH (ref 70–99)
Glucose-Capillary: 158 mg/dL — ABNORMAL HIGH (ref 70–99)
Glucose-Capillary: 158 mg/dL — ABNORMAL HIGH (ref 70–99)
Glucose-Capillary: 164 mg/dL — ABNORMAL HIGH (ref 70–99)
Glucose-Capillary: 166 mg/dL — ABNORMAL HIGH (ref 70–99)
Glucose-Capillary: 166 mg/dL — ABNORMAL HIGH (ref 70–99)
Glucose-Capillary: 169 mg/dL — ABNORMAL HIGH (ref 70–99)
Glucose-Capillary: 170 mg/dL — ABNORMAL HIGH (ref 70–99)
Glucose-Capillary: 173 mg/dL — ABNORMAL HIGH (ref 70–99)
Glucose-Capillary: 182 mg/dL — ABNORMAL HIGH (ref 70–99)
Glucose-Capillary: 194 mg/dL — ABNORMAL HIGH (ref 70–99)
Glucose-Capillary: 195 mg/dL — ABNORMAL HIGH (ref 70–99)
Glucose-Capillary: 206 mg/dL — ABNORMAL HIGH (ref 70–99)
Glucose-Capillary: 207 mg/dL — ABNORMAL HIGH (ref 70–99)
Glucose-Capillary: 209 mg/dL — ABNORMAL HIGH (ref 70–99)
Glucose-Capillary: 215 mg/dL — ABNORMAL HIGH (ref 70–99)
Glucose-Capillary: 233 mg/dL — ABNORMAL HIGH (ref 70–99)
Glucose-Capillary: 235 mg/dL — ABNORMAL HIGH (ref 70–99)
Glucose-Capillary: 242 mg/dL — ABNORMAL HIGH (ref 70–99)
Glucose-Capillary: 275 mg/dL — ABNORMAL HIGH (ref 70–99)

## 2010-07-29 LAB — HEPARIN LEVEL (UNFRACTIONATED)
Heparin Unfractionated: 0.23 IU/mL — ABNORMAL LOW (ref 0.30–0.70)
Heparin Unfractionated: 0.4 IU/mL (ref 0.30–0.70)
Heparin Unfractionated: 0.44 IU/mL (ref 0.30–0.70)
Heparin Unfractionated: 0.46 IU/mL (ref 0.30–0.70)

## 2010-07-29 LAB — COMPREHENSIVE METABOLIC PANEL
ALT: 62 U/L — ABNORMAL HIGH (ref 0–53)
Albumin: 3.1 g/dL — ABNORMAL LOW (ref 3.5–5.2)
Alkaline Phosphatase: 46 U/L (ref 39–117)
BUN: 11 mg/dL (ref 6–23)
Chloride: 101 mEq/L (ref 96–112)
Glucose, Bld: 174 mg/dL — ABNORMAL HIGH (ref 70–99)
Potassium: 3.1 mEq/L — ABNORMAL LOW (ref 3.5–5.1)
Sodium: 135 mEq/L (ref 135–145)
Total Bilirubin: 1.3 mg/dL — ABNORMAL HIGH (ref 0.3–1.2)
Total Protein: 5.9 g/dL — ABNORMAL LOW (ref 6.0–8.3)

## 2010-07-29 LAB — CBC
HCT: 34.2 % — ABNORMAL LOW (ref 39.0–52.0)
HCT: 36 % — ABNORMAL LOW (ref 39.0–52.0)
HCT: 36.5 % — ABNORMAL LOW (ref 39.0–52.0)
HCT: 38.4 % — ABNORMAL LOW (ref 39.0–52.0)
Hemoglobin: 11.9 g/dL — ABNORMAL LOW (ref 13.0–17.0)
Hemoglobin: 12.6 g/dL — ABNORMAL LOW (ref 13.0–17.0)
Hemoglobin: 12.7 g/dL — ABNORMAL LOW (ref 13.0–17.0)
Hemoglobin: 13.8 g/dL (ref 13.0–17.0)
MCHC: 34.8 g/dL (ref 30.0–36.0)
MCHC: 35 g/dL (ref 30.0–36.0)
MCV: 91.5 fL (ref 78.0–100.0)
MCV: 91.6 fL (ref 78.0–100.0)
MCV: 91.8 fL (ref 78.0–100.0)
Platelets: 192 10*3/uL (ref 150–400)
Platelets: 243 10*3/uL (ref 150–400)
Platelets: 268 10*3/uL (ref 150–400)
Platelets: 292 10*3/uL (ref 150–400)
RBC: 3.77 MIL/uL — ABNORMAL LOW (ref 4.22–5.81)
RBC: 3.92 MIL/uL — ABNORMAL LOW (ref 4.22–5.81)
RBC: 4.04 MIL/uL — ABNORMAL LOW (ref 4.22–5.81)
RBC: 4.05 MIL/uL — ABNORMAL LOW (ref 4.22–5.81)
RBC: 4.34 MIL/uL (ref 4.22–5.81)
RDW: 13 % (ref 11.5–15.5)
RDW: 13.2 % (ref 11.5–15.5)
WBC: 12.7 10*3/uL — ABNORMAL HIGH (ref 4.0–10.5)
WBC: 13.6 10*3/uL — ABNORMAL HIGH (ref 4.0–10.5)
WBC: 7.5 10*3/uL (ref 4.0–10.5)
WBC: 7.7 10*3/uL (ref 4.0–10.5)
WBC: 8.8 10*3/uL (ref 4.0–10.5)

## 2010-07-29 LAB — PROTIME-INR
INR: 1.2 (ref 0.00–1.49)
INR: 1.2 (ref 0.00–1.49)
INR: 1.2 (ref 0.00–1.49)
INR: 1.3 (ref 0.00–1.49)
INR: 1.6 — ABNORMAL HIGH (ref 0.00–1.49)
Prothrombin Time: 15.1 seconds (ref 11.6–15.2)
Prothrombin Time: 15.7 seconds — ABNORMAL HIGH (ref 11.6–15.2)
Prothrombin Time: 15.9 seconds — ABNORMAL HIGH (ref 11.6–15.2)
Prothrombin Time: 16.9 seconds — ABNORMAL HIGH (ref 11.6–15.2)
Prothrombin Time: 20.7 seconds — ABNORMAL HIGH (ref 11.6–15.2)

## 2010-07-29 LAB — DIFFERENTIAL
Basophils Relative: 1 % (ref 0–1)
Eosinophils Absolute: 0.4 10*3/uL (ref 0.0–0.7)
Lymphs Abs: 2.1 10*3/uL (ref 0.7–4.0)
Monocytes Relative: 11 % (ref 3–12)
Neutro Abs: 5.3 10*3/uL (ref 1.7–7.7)
Neutrophils Relative %: 60 % (ref 43–77)

## 2010-07-29 LAB — URINE CULTURE
Colony Count: NO GROWTH
Culture: NO GROWTH

## 2010-07-29 LAB — POCT I-STAT 4, (NA,K, GLUC, HGB,HCT): Sodium: 134 mEq/L — ABNORMAL LOW (ref 135–145)

## 2010-07-29 LAB — LIPID PANEL
HDL: 48 mg/dL (ref >39)
Total CHOL/HDL Ratio: 5.1 RATIO

## 2010-07-29 LAB — HEMOGLOBIN A1C: Hgb A1c MFr Bld: 15.2 % — ABNORMAL HIGH (ref 4.6–6.1)

## 2010-07-30 LAB — POCT CARDIAC MARKERS
CKMB, poc: 31.4 ng/mL (ref 1.0–8.0)
CKMB, poc: 34.8 ng/mL (ref 1.0–8.0)
Myoglobin, poc: >500 ng/mL (ref 12–200)
Myoglobin, poc: >500 ng/mL (ref 12–200)
Troponin i, poc: 11.5 ng/mL (ref 0.00–0.09)
Troponin i, poc: 5.91 ng/mL (ref 0.00–0.09)

## 2010-07-30 LAB — HEPATIC FUNCTION PANEL
ALT: 55 U/L — ABNORMAL HIGH (ref 0–53)
AST: 110 U/L — ABNORMAL HIGH (ref 0–37)
Albumin: 3.8 g/dL (ref 3.5–5.2)
Alkaline Phosphatase: 66 U/L (ref 39–117)
Bilirubin, Direct: 0.3 mg/dL (ref 0.0–0.3)
Indirect Bilirubin: 1.1 mg/dL — ABNORMAL HIGH (ref 0.3–0.9)
Total Bilirubin: 1.4 mg/dL — ABNORMAL HIGH (ref 0.3–1.2)
Total Protein: 7.4 g/dL (ref 6.0–8.3)

## 2010-07-30 LAB — GLUCOSE, CAPILLARY
Glucose-Capillary: 123 mg/dL — ABNORMAL HIGH (ref 70–99)
Glucose-Capillary: 146 mg/dL — ABNORMAL HIGH (ref 70–99)
Glucose-Capillary: 222 mg/dL — ABNORMAL HIGH (ref 70–99)
Glucose-Capillary: 259 mg/dL — ABNORMAL HIGH (ref 70–99)
Glucose-Capillary: 280 mg/dL — ABNORMAL HIGH (ref 70–99)
Glucose-Capillary: 323 mg/dL — ABNORMAL HIGH (ref 70–99)
Glucose-Capillary: 486 mg/dL — ABNORMAL HIGH (ref 70–99)
Glucose-Capillary: 581 mg/dL (ref 70–99)

## 2010-07-30 LAB — POCT I-STAT, CHEM 8
BUN: 13 mg/dL (ref 6–23)
BUN: 16 mg/dL (ref 6–23)
BUN: 17 mg/dL (ref 6–23)
Calcium, Ion: 1.06 mmol/L — ABNORMAL LOW (ref 1.12–1.32)
Calcium, Ion: 1.11 mmol/L — ABNORMAL LOW (ref 1.12–1.32)
Calcium, Ion: 1.14 mmol/L (ref 1.12–1.32)
Chloride: 101 mEq/L (ref 96–112)
Chloride: 103 meq/L (ref 96–112)
Chloride: 99 meq/L (ref 96–112)
Creatinine, Ser: 0.9 mg/dL (ref 0.4–1.5)
Creatinine, Ser: 0.9 mg/dL (ref 0.4–1.5)
Creatinine, Ser: 1 mg/dL (ref 0.4–1.5)
Glucose, Bld: 105 mg/dL — ABNORMAL HIGH (ref 70–99)
Glucose, Bld: 651 mg/dL (ref 70–99)
Glucose, Bld: 686 mg/dL (ref 70–99)
HCT: 39 % (ref 39.0–52.0)
HCT: 50 % (ref 39.0–52.0)
HCT: 50 % (ref 39.0–52.0)
Hemoglobin: 13.3 g/dL (ref 13.0–17.0)
Hemoglobin: 17 g/dL (ref 13.0–17.0)
Hemoglobin: 17 g/dL (ref 13.0–17.0)
Potassium: 4 mEq/L (ref 3.5–5.1)
Potassium: 4.6 meq/L (ref 3.5–5.1)
Potassium: 4.7 mEq/L (ref 3.5–5.1)
Sodium: 128 meq/L — ABNORMAL LOW (ref 135–145)
Sodium: 129 mEq/L — ABNORMAL LOW (ref 135–145)
Sodium: 142 meq/L (ref 135–145)
TCO2: 19 mmol/L (ref 0–100)
TCO2: 21 mmol/L (ref 0–100)
TCO2: 28 mmol/L (ref 0–100)

## 2010-07-30 LAB — CK TOTAL AND CKMB (NOT AT ARMC)
CK, MB: 166.1 ng/mL — ABNORMAL HIGH (ref 0.3–4.0)
CK, MB: 80.7 ng/mL — ABNORMAL HIGH (ref 0.3–4.0)
Relative Index: 7 — ABNORMAL HIGH (ref 0.0–2.5)

## 2010-07-30 LAB — DIFFERENTIAL
Basophils Absolute: 0 K/uL (ref 0.0–0.1)
Basophils Relative: 0 % (ref 0–1)
Eosinophils Absolute: 0.1 K/uL (ref 0.0–0.7)
Eosinophils Relative: 0 % (ref 0–5)
Lymphocytes Relative: 13 % (ref 12–46)
Lymphs Abs: 2 10*3/uL (ref 0.7–4.0)
Monocytes Absolute: 0.5 K/uL (ref 0.1–1.0)
Monocytes Relative: 3 % (ref 3–12)
Neutro Abs: 13.2 10*3/uL — ABNORMAL HIGH (ref 1.7–7.7)
Neutrophils Relative %: 83 % — ABNORMAL HIGH (ref 43–77)

## 2010-07-30 LAB — BASIC METABOLIC PANEL WITH GFR
CO2: 19 meq/L (ref 19–32)
Chloride: 98 meq/L (ref 96–112)
GFR calc non Af Amer: >60 mL/min (ref >60)
Glucose, Bld: 607 mg/dL (ref 70–99)

## 2010-07-30 LAB — CBC
HCT: 42.3 % (ref 39.0–52.0)
HCT: 47.2 % (ref 39.0–52.0)
Hemoglobin: 14.6 g/dL (ref 13.0–17.0)
Hemoglobin: 16.3 g/dL (ref 13.0–17.0)
MCHC: 34.5 g/dL (ref 30.0–36.0)
MCHC: 34.7 g/dL (ref 30.0–36.0)
MCV: 90.9 fL (ref 78.0–100.0)
MCV: 91.3 fL (ref 78.0–100.0)
Platelets: 278 K/uL (ref 150–400)
RBC: 5.19 MIL/uL (ref 4.22–5.81)
RDW: 12.5 % (ref 11.5–15.5)
RDW: 12.7 % (ref 11.5–15.5)
WBC: 15.8 10*3/uL — ABNORMAL HIGH (ref 4.0–10.5)

## 2010-07-30 LAB — TROPONIN I
Troponin I: >100 ng/mL (ref 0.00–0.06)
Troponin I: >100 ng/mL (ref 0.00–0.06)

## 2010-07-30 LAB — BASIC METABOLIC PANEL
BUN: 15 mg/dL (ref 6–23)
Calcium: 8.7 mg/dL (ref 8.4–10.5)
Creatinine, Ser: 0.99 mg/dL (ref 0.4–1.5)
GFR calc Af Amer: >60 mL/min (ref >60)
Potassium: 4.4 mEq/L (ref 3.5–5.1)
Sodium: 129 mEq/L — ABNORMAL LOW (ref 135–145)

## 2010-07-30 LAB — POCT I-STAT 3, VENOUS BLOOD GAS (G3P V)
Acid-base deficit: 3 mmol/L — ABNORMAL HIGH (ref 0.0–2.0)
Bicarbonate: 23.5 mEq/L (ref 20.0–24.0)

## 2010-07-30 LAB — APTT: aPTT: 26 s (ref 24–37)

## 2010-07-30 LAB — POCT I-STAT 3, ART BLOOD GAS (G3+)
Acid-base deficit: 6 mmol/L — ABNORMAL HIGH (ref 0.0–2.0)
Bicarbonate: 19.3 mEq/L — ABNORMAL LOW (ref 20.0–24.0)
O2 Saturation: 90 %
TCO2: 20 mmol/L (ref 0–100)

## 2010-07-30 LAB — PROTIME-INR
INR: 0.9 (ref 0.00–1.49)
Prothrombin Time: 12.5 s (ref 11.6–15.2)

## 2010-07-30 LAB — LIPASE, BLOOD: Lipase: 50 U/L (ref 11–59)

## 2010-07-30 LAB — PLATELET COUNT: Platelets: 263 10*3/uL (ref 150–400)

## 2010-07-30 LAB — MAGNESIUM: Magnesium: 2 mg/dL (ref 1.5–2.5)

## 2010-08-06 ENCOUNTER — Ambulatory Visit: Payer: Self-pay | Admitting: Internal Medicine

## 2010-08-09 ENCOUNTER — Encounter: Payer: Medicare Other | Admitting: *Deleted

## 2010-08-09 ENCOUNTER — Ambulatory Visit: Payer: Self-pay | Admitting: Internal Medicine

## 2010-08-16 ENCOUNTER — Ambulatory Visit (INDEPENDENT_AMBULATORY_CARE_PROVIDER_SITE_OTHER): Payer: MEDICARE | Admitting: *Deleted

## 2010-08-16 ENCOUNTER — Encounter: Payer: Self-pay | Admitting: Internal Medicine

## 2010-08-16 ENCOUNTER — Ambulatory Visit (INDEPENDENT_AMBULATORY_CARE_PROVIDER_SITE_OTHER): Payer: MEDICARE | Admitting: Internal Medicine

## 2010-08-16 VITALS — BP 106/64 | HR 57 | Resp 18 | Ht 71.0 in | Wt 223.0 lb

## 2010-08-16 DIAGNOSIS — I5023 Acute on chronic systolic (congestive) heart failure: Secondary | ICD-10-CM

## 2010-08-16 DIAGNOSIS — I238 Other current complications following acute myocardial infarction: Secondary | ICD-10-CM

## 2010-08-16 DIAGNOSIS — I2510 Atherosclerotic heart disease of native coronary artery without angina pectoris: Secondary | ICD-10-CM

## 2010-08-16 DIAGNOSIS — E785 Hyperlipidemia, unspecified: Secondary | ICD-10-CM

## 2010-08-16 DIAGNOSIS — I5022 Chronic systolic (congestive) heart failure: Secondary | ICD-10-CM

## 2010-08-16 DIAGNOSIS — I6350 Cerebral infarction due to unspecified occlusion or stenosis of unspecified cerebral artery: Secondary | ICD-10-CM

## 2010-08-16 LAB — POCT INR: INR: 2.5

## 2010-08-16 NOTE — Assessment & Plan Note (Signed)
No evidence of ischemia. Continue current regimen.   

## 2010-08-16 NOTE — Assessment & Plan Note (Signed)
Followed by Dr. Jeannetta Nap. LDL at goal. Can titrate fish oil as needed to help with HDL.

## 2010-08-16 NOTE — Patient Instructions (Signed)
Your physician has requested that you have an echocardiogram. Echocardiography is a painless test that uses sound waves to create images of your heart. It provides your doctor with information about the size and shape of your heart and how well your heart's chambers and valves are working. This procedure takes approximately one hour. There are no restrictions for this procedure. Would like in 4 weeks with CVRR appt. (dx 428.23) Your physician wants you to follow-up in: 6 months. You will receive a reminder letter in the mail two months in advance. If you don't receive a letter, please call our office to schedule the follow-up appointment.

## 2010-08-16 NOTE — Progress Notes (Signed)
HPI:  Marcus Ramos is a 70 year old with diabetes, HTN who suffered a large anterolateral MI in 6/10 c/b cardiogenic shock. He was treated with emergent bare-metal stenting of the LAD, as well as a staged drug-eluting stent to the circumflex. He had failed percutaneous coronary intervention of the RCA. Ejection fraction was 25% with global hypokinesis, and question of a left ventricular thrombus. He was placed on Coumadin as well as aspirin and Plavix. 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  Had echo 8/10 which confirmed EF 25-30%. MRI EF 23%. With full thickness scar. No signifcant viability.  We previously arranged for him to see Dr. Johney Frame to discuss ICD placement. He saw Dr. Johney Frame and was set-up for an ICD but he cancelled his implant appt saying he didn't want a device at this time.  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  Has been very, very active without CP. Doing gardening, farming and splitting wood. 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 No orthopnea, PND or edema. No palpitations or syncope. Tolerating coumadin without signifcant bleeding. Taking lasix every other day.   Gaining weight gradually which he attributes to a good appetite.   Lipids followed by Dr. Jeannetta Nap. HDL 29 LDL 41. Started on fish oil.   ROS: All systems negative except as listed in HPI, PMH and Problem List.  Past Medical History  Diagnosis Date  . 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 Essential hypertension, benign   . Type II or unspecified type diabetes mellitus without mention of complication, not stated as uncontrolled   . Obesity, unspecified   . Hypopotassemia   . Gout   . Embolic stroke   . Stented coronary artery     Bare-metal stenting of 100% occluded left descending artery. Staged drug eluting stenting of circumflex artery    Current Outpatient  Prescriptions  Medication Sig Dispense Refill  . allopurinol (ZYLOPRIM) 100 MG tablet Take 100 mg by mouth daily.        Marland Kitchen aspirin 81 MG tablet Take 81 mg by mouth daily.        Marland Kitchen atorvastatin (LIPITOR) 40 MG tablet Take 40 mg by mouth daily.        . carvedilol (COREG) 6.25 MG tablet Take 6.25 mg by mouth 2 (two) times daily with a meal.        . colchicine 0.6 MG tablet Take 0.6 mg by mouth as needed.        . enalapril (VASOTEC) 20 MG tablet Take 20 mg by mouth 2 (two) times daily.        . fish oil-omega-3 fatty acids 1000 MG capsule Take 2 g by mouth daily. 2 capsules daily.       . furosemide (LASIX) 20 MG tablet Take 20 mg by mouth every other day.        . metFORMIN (GLUCOPHAGE) 500 MG tablet Take 500 mg by mouth daily.        . nitroGLYCERIN (NITROSTAT) 0.4 MG SL tablet Place 0.4 mg under the tongue every 5 (five) minutes as needed.        Marland Kitchen spironolactone (ALDACTONE) 25 MG tablet Take 25 mg by mouth daily.        Marland Kitchen warfarin (COUMADIN) 5 MG tablet Take by mouth as directed.           PHYSICAL EXAM: Filed Vitals:  08/16/10 1219  BP: 106/64  Pulse: 57  Resp: 18   General:  Well appearing. no resp difficulty HEENT: normal Neck: supple. no JVD. Carotids 2+ bilat; no bruits. No lymphadenopathy or thryomegaly appreciated. Cor: PMI laterally displaced. Distant. Regular rate & rhythm. No rubs, gallops, murmur. Lungs: clear Abdomen: soft, nontender, nondistended. No hepatosplenomegaly. No bruits or masses. Good bowel sounds. + umbilical hernia Extremities: no cyanosis, clubbing, rash, edema Neuro: alert & orientedx3, cranial nerves grossly intact. moves all 4 extremities w/o difficulty. affect pleasant    ECG: Sinus brady 57 with occasional PVCs. Anterior Q waves. Deep anterolateral TWI (chronic)   ASSESSMENT & PLAN:

## 2010-08-16 NOTE — Assessment & Plan Note (Signed)
Continue coumadin. Given full thickness LV scar would be at high risk for recurrent clot so would continue Coumadin life-long.

## 2010-08-16 NOTE — Assessment & Plan Note (Signed)
Doing great NYHA II. Volume status looks great. Continue current regimen. F/u echo next month to assess for recovery. Refuses ICD.

## 2010-09-04 NOTE — Cardiovascular Report (Signed)
NAME:  DOREAN, Marcus Ramos NO.:  [id number]   MEDICAL RECORD NO.:  [id number]          PATIENT TYPE:  INP   LOCATION:  2506                         FACILITY:  [facility]   PHYSICIAN:  Veverly Fells. Excell Seltzer, MD  DATE OF BIRTH:  Mar 30, 1941   DATE OF PROCEDURE:  10/26/2008  DATE OF DISCHARGE:  10/29/2008                            CARDIAC CATHETERIZATION   PROCEDURE:  Fractional flow reserve of the left anterior descending,  fractional flow reserve of the first diagonal, percutaneous transluminal  coronary angioplasty and stenting of the distal left circumflex, an  attempted percutaneous transluminal coronary angioplasty of the right  coronary artery.   PROCEDURAL INDICATIONS:  Mr. Golson is a 70 year old gentleman who  presented last week with an anterior wall myocardial infarction.  He had  a large infarct and was critically ill.  He required hemodynamic support  with the balloon pump following his intervention.  He was found to have  severe multivessel CAD.  There was a surgical consultation because of  multivessel coronary artery disease.  He was deemed not to be an  operative candidate because of his severe left ventricular dysfunction  and recent anterior MI as well as poor surgical targets for bypass.  He  was referred for percutaneous intervention after review of the films.  I  felt the patient had a moderately tight diagonal stenosis, severe  stenosis of the distal left circumflex in a bifurcation point, and total  occlusion of the right coronary artery with an indeterminate target of  his distal vessel.  I elected to perform FFR-guided multivessel  intervention.   The patient was brought to the cath lab.  Procedural risks and  indications were explained.  Informed consent was obtained.  The right  groin was prepped, draped and anesthetized with 1% lidocaine using  modified Seldinger technique.  A 6-French sheath was placed in the right  femoral artery.  Angiomax was  used for anticoagulation.  The patient had  been pretreated with Plavix.  A 6-French XB 3.5-cm guide catheter was  inserted.  Once therapeutic ACT was achieved, a PrimeWire was inserted.  The pressures were normalized with the wire inside the guide catheter.  Initial angiography demonstrated a moderately tight stenosis of the  diagonal branch.  There is also a marked step-down off the distal edge  of the LAD stent that was placed in the previous week.  The edge of the  stent had a very eccentric, hazy appearance.  There was TIMI III flow  present.  I thought this should also be interrogated with the pressure  wire.  The pressure wire was first passed down the diagonal branch.  Intracoronary adenosine was given in graduated doses.  The FFR was  greater than 0.9 by all measurements.  The wire was pulled back, then  advanced down into the distal LAD beyond the area of concern within the  stent.  Intracoronary adenosine was given again given.  The FFR was also  above 0.9 in this vessel.  At that point, the pressure wire was removed.  Attention was turned to the left circumflex.  There was severe  stenosis  at the bifurcation of the second OM branch with the AV groove  circumflex.  The wire was passed into the OM branch without much  difficulty.  A 2.5 x 15-mm apex balloon was advanced and inflated to 6  atmospheres for approximately 30 seconds.  Following balloon dilatation,  angiography was performed to measure the lesion length.  A 2.5 x 18-mm  Xience drug-eluting stent was advanced and carefully positioned.  The  stent was deployed at 12 atmospheres.  It was well expanded with only a  mild waist in the midportion of the stent.  There was a step-down off  the distal portion of the stent.  I elected to post dilate the  midportion of the stent with a 2.75 x 15-mm Quantum apex balloon, which  was inflated to 16 atmospheres for 27 seconds.  At the completion of the  intervention, there is an  excellent angiographic result with TIMI III  flow in the vessel.  The stent was well expanded.  The AV groove  circumflex arising from the midportion of the stent did not appear  obstructed.  Attention was then turned to the right coronary artery.  A  6-French JR-4 guide catheter was advanced.  Initial angiography showed  total occlusion in the midportion of the right coronary artery.  There  did appear to be a small degree of antegrade flow into the distal right  coronary artery.  It was difficult to tell how big the distal vessel  was.  I thought treatment of the RCA was worth in attempt.  The patient  had a cardiac MR study that suggested some viability of the inferior  wall.  A Whisper wire was advanced into the distal vessel and it was  able to cross the lesion with a moderate amount of difficulty.  I first  attempted a 2.0 x 15-mm apex balloon, but it would not cross the lesion.  I performed an inflation with the balloon in the nose of the lesion, but  it was unsuccessful at crossing even after an early inflation.  At that  point, a 1.5 x 15-mm apex push balloon was advanced and it also would  not cross the total occlusion and did another inflation up to 14  atmospheres for 37 seconds.  With further attempts at crossing the  lesion, the balloon would not pass.  I then pulled out the Whisper wire  and attempted to cross with a Cougar wire for better support.  The  Cougar wire would not pass even after prolonged attempt.  At that point,  I thought the risk and benefit of further attempts across it dilating  this lesion was unfavorable, and I elected to discontinue the procedure.  The guide catheter was removed.  A final angiogram demonstrated  unchanged appearance of the RCA.  A femoral angiogram was then performed  and a Perclose device was used to close the femoral arteriotomy.   FINAL ASSESSMENT:  1. Negative fractional flow reserve of the left anterior descending      and first  diagonal branches.  2. Successful percutaneous coronary intervention of the left      circumflex with a single drug-eluting stent.  3. Failed percutaneous coronary intervention of the right coronary      artery due to inability to cross the lesion with the balloon.   RECOMMENDATIONS:  The patient should receive 12 months of dual  antiplatelet therapy with aspirin and Plavix.      Veverly Fells.  Excell Seltzer, MD  Electronically Signed     MDC/MEDQ  D:  10/30/2008  T:  10/31/2008  Job:  [id number]
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
## 2010-09-04 NOTE — Cardiovascular Report (Signed)
NAME:  Marcus Ramos, Marcus Ramos NO.:  [id number]   MEDICAL RECORD NO.:  [id number]          PATIENT TYPE:  INP   LOCATION:  2304                         FACILITY:  [facility]   PHYSICIAN:  Everardo Beals. Juanda Chance, MD, FACCDATE OF BIRTH:  04/28/1940   DATE OF PROCEDURE:  10/19/2008  DATE OF DISCHARGE:                            CARDIAC CATHETERIZATION   PROCEDURE:  1. Intra-aortic balloon pump.  2. PCI.  3. Right heart cath.   CLINICAL HISTORY:  Marcus Ramos is a 70 years old, who has no known  prior heart disease and no primary care physician.  Last night at 10:30,  he developed chest pain and he came to [facility] Emergency Room in the  early morning.  His markers returned positive and his ECG showed an  acute anterolateral MI.  He was transported to [facility] for  intervention.  Dr. Gala Romney did the diagnostic procedure.   PROCEDURE:  Dr. Arvilla Meres did the diagnostic procedure via the  right femoral artery using an arterial sheath and 6-French Preformed  coronary catheters.  I then placed an intra-aortic balloon pump via the  left femoral artery.  We then proceeded with coronary intervention on  the totally occluded LAD.   The patient was given Angiomax bolus infusion and had been previously  given 600 mg of Plavix and 4 chewable aspirin.  We used an XB LAD 4  guiding catheter with side holes.  We passed the Prowater wire across  the totally occluded LAD without too much difficulty.  The LAD appeared  to be diffusely diseased.  We used a 2.0 x 20-mm balloon and performed  multiple inflations, but we were only able to get TIMI I flow.  We then  passed the Fetch catheter and did aspiration and injected intracoronary  verapamil and nitroglycerin.  This improved to TIMI I to II flow.  We  then went in with a larger 2.5 x 20-mm apex balloon and performed  several inflations up to 8 atmospheres.  We were then able to get TIMI  II flow.  There appeared to be a dissection,  so we went in with a 2.5 x  23-mm Vision stent and deployed this with 1 inflation of 11 atmospheres  for 30 seconds.  We then went back in with a Fetch catheter and gave  intracoronary verapamil and nitroglycerin.  In the end, we were able to  achieve a TIMI III flow.  The vessel was diffusely diseased in its  midportion.   Following the PCI procedure, we placed a Swan-Ganz catheter via the  right femoral vein.   The patient was quite sick during the procedure, but remained  hemodynamically stable on the balloon pump.   RESULTS:  Right coronary artery:  The right coronary was completely  occluded at its midportion and the distal right coronary filled partly  by bridging collaterals.   Left main coronary:  The left main coronary artery was free of  significant disease.   Left anterior descending artery:  Left anterior descending artery gave  rise to a large diagonal branch and then was completely occluded  proximally after a septal perforator.  There was 80% stenosis in the  first large diagonal branch.   The circumflex artery:  The circumflex artery gave rise to a large  marginal branch and 2 posterolateral branches.  There was 40% narrowing  in the proximal circumflex artery.  There was 90% narrowing in the  distal circumflex artery at its bifurcation into 2 posterolateral  branches.   The left ventriculogram:  The left ventriculogram was performed in the  RAO projection with a hand injection showed global hypokinesis with an  estimated ejection fraction of 25%.   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  There was no major aortoiliac obstruction.   Following stenting of the lesion in the LAD, the stenosis improved from  100% to 0%.  The LAD was diffusely irregular in its distal portion with  a 50% narrowing in the midportion.   HEMODYNAMIC DATA:  The right atrial pressure was 10 mean.  Pulmonary  artery pressure was 32/12 with a mean of 21.   Pulmonary wedge pressure  was 16 mean.  Aortic pressure was 105/80.  Left index pressure was  105/35.   The patient had the onset of chest pain at 10:30 p.m. on June 29.  He  arrived at [facility] emergency department at 2:17 a.m.  He arrived at  the cath lab at 4:14 a.m.  The first balloon inflation was at 5:30 a.m.  This gave a total balloon time of 2 hours and 13 minutes and refusion  time of 7 hours.   CONCLUSION:  1. Acute anterior wall myocardial infarction with total occlusion of      the proximal LAD, 80% narrowing in the first large diagonal branch      LAD, 90% narrowing in the distal circumflex artery, and total      occlusion of the mid-right coronary artery with global hypokinesis      and estimated ejection fraction of 25%.  2. Placement of intra-aortic balloon pump.  3. Successful PCI of the totally occluded LAD with improvement in      central narrowing from 100% to 0% with improvement in the flow from      TIMI 0 to TIMI III flow using a Vision bare-metal stent.   RECOMMENDATIONS:  The patient is a severely ill and his outlook is  limited by his severe left ventricular dysfunction.  I think he will  need further revascularization probably with bypass surgery if he  improves and his condition permits.      Bruce Elvera Lennox Juanda Chance, MD, [facility]  Electronically Signed     BRB/MEDQ  D:  10/19/2008  T:  10/19/2008  Job:  [id number]

## 2010-09-04 NOTE — Discharge Summary (Signed)
NAME:  Marcus Ramos, CUPPLES NO.:  [id number]   MEDICAL RECORD NO.:  [id number]          PATIENT TYPE:  INP   LOCATION:  2506                         FACILITY:  [facility]   PHYSICIAN:  Jesse Sans. Wall, MD, FACCDATE OF BIRTH:  [birth year]   DATE OF ADMISSION:  10/19/2008  DATE OF DISCHARGE:  10/29/2008                               DISCHARGE SUMMARY   PRIMARY CARDIOLOGIST:  Bevelyn Buckles. Bensimhon, MD   DISCHARGE DIAGNOSES:  1. Severe ischemic cardiomyopathy.  1A.  Status post acute anterolateral myocardial infarction, complicated  by cardiogenic shock.  1B.  Status post emergent bare-metal stenting of 100% occluded left  anterior descending artery.  1C.  Status post staged drug-eluting stenting of circumflex artery;  failed percutaneous coronary intervention of right coronary artery.  1D.  Ejection fraction 25%; global hypokinesis.  1. Question, left ventricular thrombus.  2A.  Negative by cardiac MRI imaging.  1. Postoperative embolic infarctions (nonhemorrhagic).  3A.  Associated blurred vision/diplopia.  3B.  Recommended treatment with Coumadin (x3 months), and aspirin/Plavix  long-term.  1. Type 2 diabetes mellitus, newly diagnosed.   SECONDARY DIAGNOSIS:  History of hypertension.   REASON FOR ADMISSION:  Mr. Croft is a 70 year old male, with no prior  history of heart disease, who initially presented to [facility] ED with  complaint of chest pain.  Cardiac markers were positive and EKG was  consistent with acute anterolateral MI.  Following stabilization, he was  transferred directly to the cardiac catheterization lab at [facility]   HOSPITAL COURSE:  The patient initially presented to Dr. Arvilla Meres, who in turn referred him to Dr. Charlies Constable for emergent  coronary angiography.  The patient did require stabilization with intra-  aortic balloon pump.  He was found to have 100% occlusion of the  proximal LAD, which was successfully treated with a  bare-metal stent.  Ejection fraction was estimated at 25%, with global hypokinesis.  Dr.  Juanda Chance felt that he was severely ill and that his outlook was limited by  severe LV dysfunction, it would most likely require revascularization  surgery, if his condition stabilized.   A postop echo suggested possible LV thrombus.  However, subsequent  imaging by our cardiac MRI was negative for evidence of thrombus.   Postoperative course was also complicated by development of blurred and  double vision.  He was referred to Neurology, who concluded that this  was due to embolic infarctions (nonhemorrhagic).  MRI and MRA of the  head and brain were obtained, yielding no evidence of stenosis or  occlusion.  Following extensive consultation, including Dr. Myrtis Ser, the  patient, and the family, the plan was to treat with Coumadin for a  minimum of 3 months, and continued an aspirin/Plavix long-term.  The  patient was started on Coumadin and arrangements were made for early  follow up in our Coumadin Clinic here in [place].   With respect to possible revascularization surgery, Dr. Donata Clay  reviewed the cardiac MRI, and noted that there was little, or no  evidence of viable myocardium.  He therefore concluded that the patient  would not  benefit from surgical revascularization.  He did, however,  recommend consideration of PCI for both the diagonal circumflex  arteries, rather than bypass surgery.   The patient was cleared for discharge on October 29, 2008, and remained in  hemodynamically stable condition.   DISCHARGE MEDICATIONS:  1. Aspirin 325 mg daily.  2. Plavix 75 mg daily.  3. Coumadin 5 mg daily.  4. Coreg 6.25 mg b.i.d.  5. Enalapril 2.5 mg b.i.d.  6. Lasix 20 mg daily.  7. Spirolactone 12.5 mg daily.  8. Metformin 500 mg daily.  9. Nitrostat 0.4 mg p.r.n.   INSTRUCTIONS:  1. Arrangements have been made for the patient to follow up with      Jacolyn Reedy, PA-C in our [facility] on November 16, 2008, at      11:45 a.m.  He will subsequently follow up with Dr. Arvilla Meres.  2. The patient is advised to establish with a primary care physician.      In the meanwhile, he has been started on metformin, will need early      follow up and continued monitoring management for his diabetes.  3. The patient has been scheduled to establish with our [facility]      Coumadin Clinic on November 01, 2008, at 2:45 p.m.  I have arranged for      him to have a follow up BMET at that time, for monitoring of      electrolytes and renal function, on a diuretic regimen.  4. The patient is advised to contact our office in the event of any      bleeding/swelling from his groin incision site.   DISPOSITION:  Stable.   DISCHARGE LABORATORIES:  Sodium 136, potassium 3.8, BUN 8, creatinine  1.1, and glucose 142.   DISCHARGE ENCOUNTER INCLUDING PHYSICIAN TIME:  Greater than 45 minutes.      Gene Serpe, PA-C      Thomas C. Daleen Squibb, MD, [facility]  Electronically Signed   GS/MEDQ  D:  10/29/2008  T:  10/29/2008  Job:  [id number]   cc:   Kerin Perna, M.D.  Deanna Artis. Sharene Skeans, M.D.

## 2010-09-13 ENCOUNTER — Ambulatory Visit (INDEPENDENT_AMBULATORY_CARE_PROVIDER_SITE_OTHER): Payer: Medicare Other | Admitting: *Deleted

## 2010-09-13 ENCOUNTER — Ambulatory Visit (HOSPITAL_COMMUNITY): Payer: Medicare Other | Attending: Internal Medicine | Admitting: Radiology

## 2010-09-13 DIAGNOSIS — I6350 Cerebral infarction due to unspecified occlusion or stenosis of unspecified cerebral artery: Secondary | ICD-10-CM

## 2010-09-13 DIAGNOSIS — I5023 Acute on chronic systolic (congestive) heart failure: Secondary | ICD-10-CM

## 2010-09-13 DIAGNOSIS — I2510 Atherosclerotic heart disease of native coronary artery without angina pectoris: Secondary | ICD-10-CM | POA: Insufficient documentation

## 2010-09-13 DIAGNOSIS — I238 Other current complications following acute myocardial infarction: Secondary | ICD-10-CM

## 2010-09-13 DIAGNOSIS — I379 Nonrheumatic pulmonary valve disorder, unspecified: Secondary | ICD-10-CM | POA: Insufficient documentation

## 2010-09-13 DIAGNOSIS — I059 Rheumatic mitral valve disease, unspecified: Secondary | ICD-10-CM | POA: Insufficient documentation

## 2010-09-21 ENCOUNTER — Telehealth: Payer: Self-pay | Admitting: Internal Medicine

## 2010-09-21 NOTE — Telephone Encounter (Signed)
Pt given echo results 

## 2010-09-21 NOTE — Telephone Encounter (Signed)
Pt is waiting to get results on test done a week ago.  He has not heard any results.  Please call him [phone].

## 2010-10-11 ENCOUNTER — Ambulatory Visit (INDEPENDENT_AMBULATORY_CARE_PROVIDER_SITE_OTHER): Payer: Medicare Other | Admitting: *Deleted

## 2010-10-11 DIAGNOSIS — I238 Other current complications following acute myocardial infarction: Secondary | ICD-10-CM

## 2010-10-11 DIAGNOSIS — I6350 Cerebral infarction due to unspecified occlusion or stenosis of unspecified cerebral artery: Secondary | ICD-10-CM

## 2010-11-08 ENCOUNTER — Ambulatory Visit (INDEPENDENT_AMBULATORY_CARE_PROVIDER_SITE_OTHER): Payer: Medicare Other | Admitting: *Deleted

## 2010-11-08 DIAGNOSIS — I238 Other current complications following acute myocardial infarction: Secondary | ICD-10-CM

## 2010-11-08 DIAGNOSIS — I6350 Cerebral infarction due to unspecified occlusion or stenosis of unspecified cerebral artery: Secondary | ICD-10-CM

## 2010-11-09 ENCOUNTER — Other Ambulatory Visit: Payer: Self-pay | Admitting: Internal Medicine

## 2010-12-04 ENCOUNTER — Other Ambulatory Visit: Payer: Self-pay | Admitting: Internal Medicine

## 2010-12-06 ENCOUNTER — Telehealth: Payer: Self-pay | Admitting: Internal Medicine

## 2010-12-06 ENCOUNTER — Ambulatory Visit (INDEPENDENT_AMBULATORY_CARE_PROVIDER_SITE_OTHER): Payer: Medicare Other | Admitting: *Deleted

## 2010-12-06 DIAGNOSIS — I238 Other current complications following acute myocardial infarction: Secondary | ICD-10-CM

## 2010-12-06 DIAGNOSIS — I6350 Cerebral infarction due to unspecified occlusion or stenosis of unspecified cerebral artery: Secondary | ICD-10-CM

## 2010-12-06 NOTE — Telephone Encounter (Signed)
The enalipril 10mg  bid and it would be cheaper if he took 20mg  ref# [id number]

## 2010-12-07 MED ORDER — ENALAPRIL MALEATE 20 MG PO TABS: 10.0000 mg | ORAL_TABLET | Freq: Two times a day (BID) | ORAL | Status: DC

## 2010-12-21 ENCOUNTER — Telehealth: Payer: Self-pay | Admitting: Internal Medicine

## 2010-12-21 ENCOUNTER — Other Ambulatory Visit: Payer: Self-pay

## 2010-12-21 ENCOUNTER — Encounter: Payer: Self-pay | Admitting: Internal Medicine

## 2010-12-21 DIAGNOSIS — E785 Hyperlipidemia, unspecified: Secondary | ICD-10-CM

## 2010-12-21 DIAGNOSIS — I10 Essential (primary) hypertension: Secondary | ICD-10-CM

## 2010-12-21 DIAGNOSIS — I259 Chronic ischemic heart disease, unspecified: Secondary | ICD-10-CM

## 2010-12-21 NOTE — Telephone Encounter (Signed)
Need an order for lab work. Before lab was order  Bmet, liver panel / lipid panel. Pt at office now.

## 2010-12-21 NOTE — Telephone Encounter (Signed)
Done

## 2011-01-03 ENCOUNTER — Ambulatory Visit (INDEPENDENT_AMBULATORY_CARE_PROVIDER_SITE_OTHER): Payer: Medicare Other | Admitting: *Deleted

## 2011-01-03 DIAGNOSIS — I238 Other current complications following acute myocardial infarction: Secondary | ICD-10-CM

## 2011-01-03 DIAGNOSIS — I6350 Cerebral infarction due to unspecified occlusion or stenosis of unspecified cerebral artery: Secondary | ICD-10-CM

## 2011-01-03 LAB — POCT INR: INR: 2.9

## 2011-01-13 ENCOUNTER — Other Ambulatory Visit: Payer: Self-pay | Admitting: Internal Medicine

## 2011-01-31 ENCOUNTER — Ambulatory Visit (INDEPENDENT_AMBULATORY_CARE_PROVIDER_SITE_OTHER): Payer: Medicare Other | Admitting: *Deleted

## 2011-01-31 DIAGNOSIS — I6350 Cerebral infarction due to unspecified occlusion or stenosis of unspecified cerebral artery: Secondary | ICD-10-CM

## 2011-01-31 DIAGNOSIS — I238 Other current complications following acute myocardial infarction: Secondary | ICD-10-CM

## 2011-01-31 LAB — POCT INR: INR: 3

## 2011-02-04 ENCOUNTER — Other Ambulatory Visit: Payer: Self-pay | Admitting: Internal Medicine

## 2011-02-25 ENCOUNTER — Other Ambulatory Visit: Payer: Self-pay | Admitting: Internal Medicine

## 2011-02-27 ENCOUNTER — Ambulatory Visit (INDEPENDENT_AMBULATORY_CARE_PROVIDER_SITE_OTHER): Payer: Medicare Other | Admitting: *Deleted

## 2011-02-27 DIAGNOSIS — I6350 Cerebral infarction due to unspecified occlusion or stenosis of unspecified cerebral artery: Secondary | ICD-10-CM

## 2011-02-27 DIAGNOSIS — I238 Other current complications following acute myocardial infarction: Secondary | ICD-10-CM

## 2011-02-27 DIAGNOSIS — Z7901 Long term (current) use of anticoagulants: Secondary | ICD-10-CM

## 2011-03-05 ENCOUNTER — Encounter: Payer: Medicare Other | Admitting: *Deleted

## 2011-03-12 ENCOUNTER — Ambulatory Visit (INDEPENDENT_AMBULATORY_CARE_PROVIDER_SITE_OTHER): Payer: Medicare Other | Admitting: *Deleted

## 2011-03-12 DIAGNOSIS — I238 Other current complications following acute myocardial infarction: Secondary | ICD-10-CM

## 2011-03-12 DIAGNOSIS — I6350 Cerebral infarction due to unspecified occlusion or stenosis of unspecified cerebral artery: Secondary | ICD-10-CM

## 2011-03-12 DIAGNOSIS — Z7901 Long term (current) use of anticoagulants: Secondary | ICD-10-CM

## 2011-04-02 ENCOUNTER — Ambulatory Visit (INDEPENDENT_AMBULATORY_CARE_PROVIDER_SITE_OTHER): Payer: Medicare Other | Admitting: *Deleted

## 2011-04-02 DIAGNOSIS — I6350 Cerebral infarction due to unspecified occlusion or stenosis of unspecified cerebral artery: Secondary | ICD-10-CM

## 2011-04-02 DIAGNOSIS — I238 Other current complications following acute myocardial infarction: Secondary | ICD-10-CM

## 2011-04-02 DIAGNOSIS — Z7901 Long term (current) use of anticoagulants: Secondary | ICD-10-CM

## 2011-04-02 LAB — POCT INR: INR: 2.4

## 2011-04-13 ENCOUNTER — Other Ambulatory Visit: Payer: Self-pay | Admitting: Internal Medicine

## 2011-04-30 ENCOUNTER — Ambulatory Visit (INDEPENDENT_AMBULATORY_CARE_PROVIDER_SITE_OTHER): Payer: Medicare Other | Admitting: *Deleted

## 2011-04-30 DIAGNOSIS — Z7901 Long term (current) use of anticoagulants: Secondary | ICD-10-CM

## 2011-04-30 DIAGNOSIS — I238 Other current complications following acute myocardial infarction: Secondary | ICD-10-CM

## 2011-04-30 DIAGNOSIS — I6350 Cerebral infarction due to unspecified occlusion or stenosis of unspecified cerebral artery: Secondary | ICD-10-CM

## 2011-04-30 LAB — POCT INR: INR: 2.7

## 2011-05-28 ENCOUNTER — Ambulatory Visit (INDEPENDENT_AMBULATORY_CARE_PROVIDER_SITE_OTHER): Payer: Medicare Other | Admitting: *Deleted

## 2011-05-28 DIAGNOSIS — I6350 Cerebral infarction due to unspecified occlusion or stenosis of unspecified cerebral artery: Secondary | ICD-10-CM

## 2011-05-28 DIAGNOSIS — Z7901 Long term (current) use of anticoagulants: Secondary | ICD-10-CM

## 2011-05-28 DIAGNOSIS — I238 Other current complications following acute myocardial infarction: Secondary | ICD-10-CM

## 2011-06-17 IMAGING — CR DG CHEST 2V
2 series · 2 of 2 positions shown · non-contrast
Comparison: None

CLINICAL DATA: Back pain and cough

CHEST - 2 VIEW

[w chest pa]
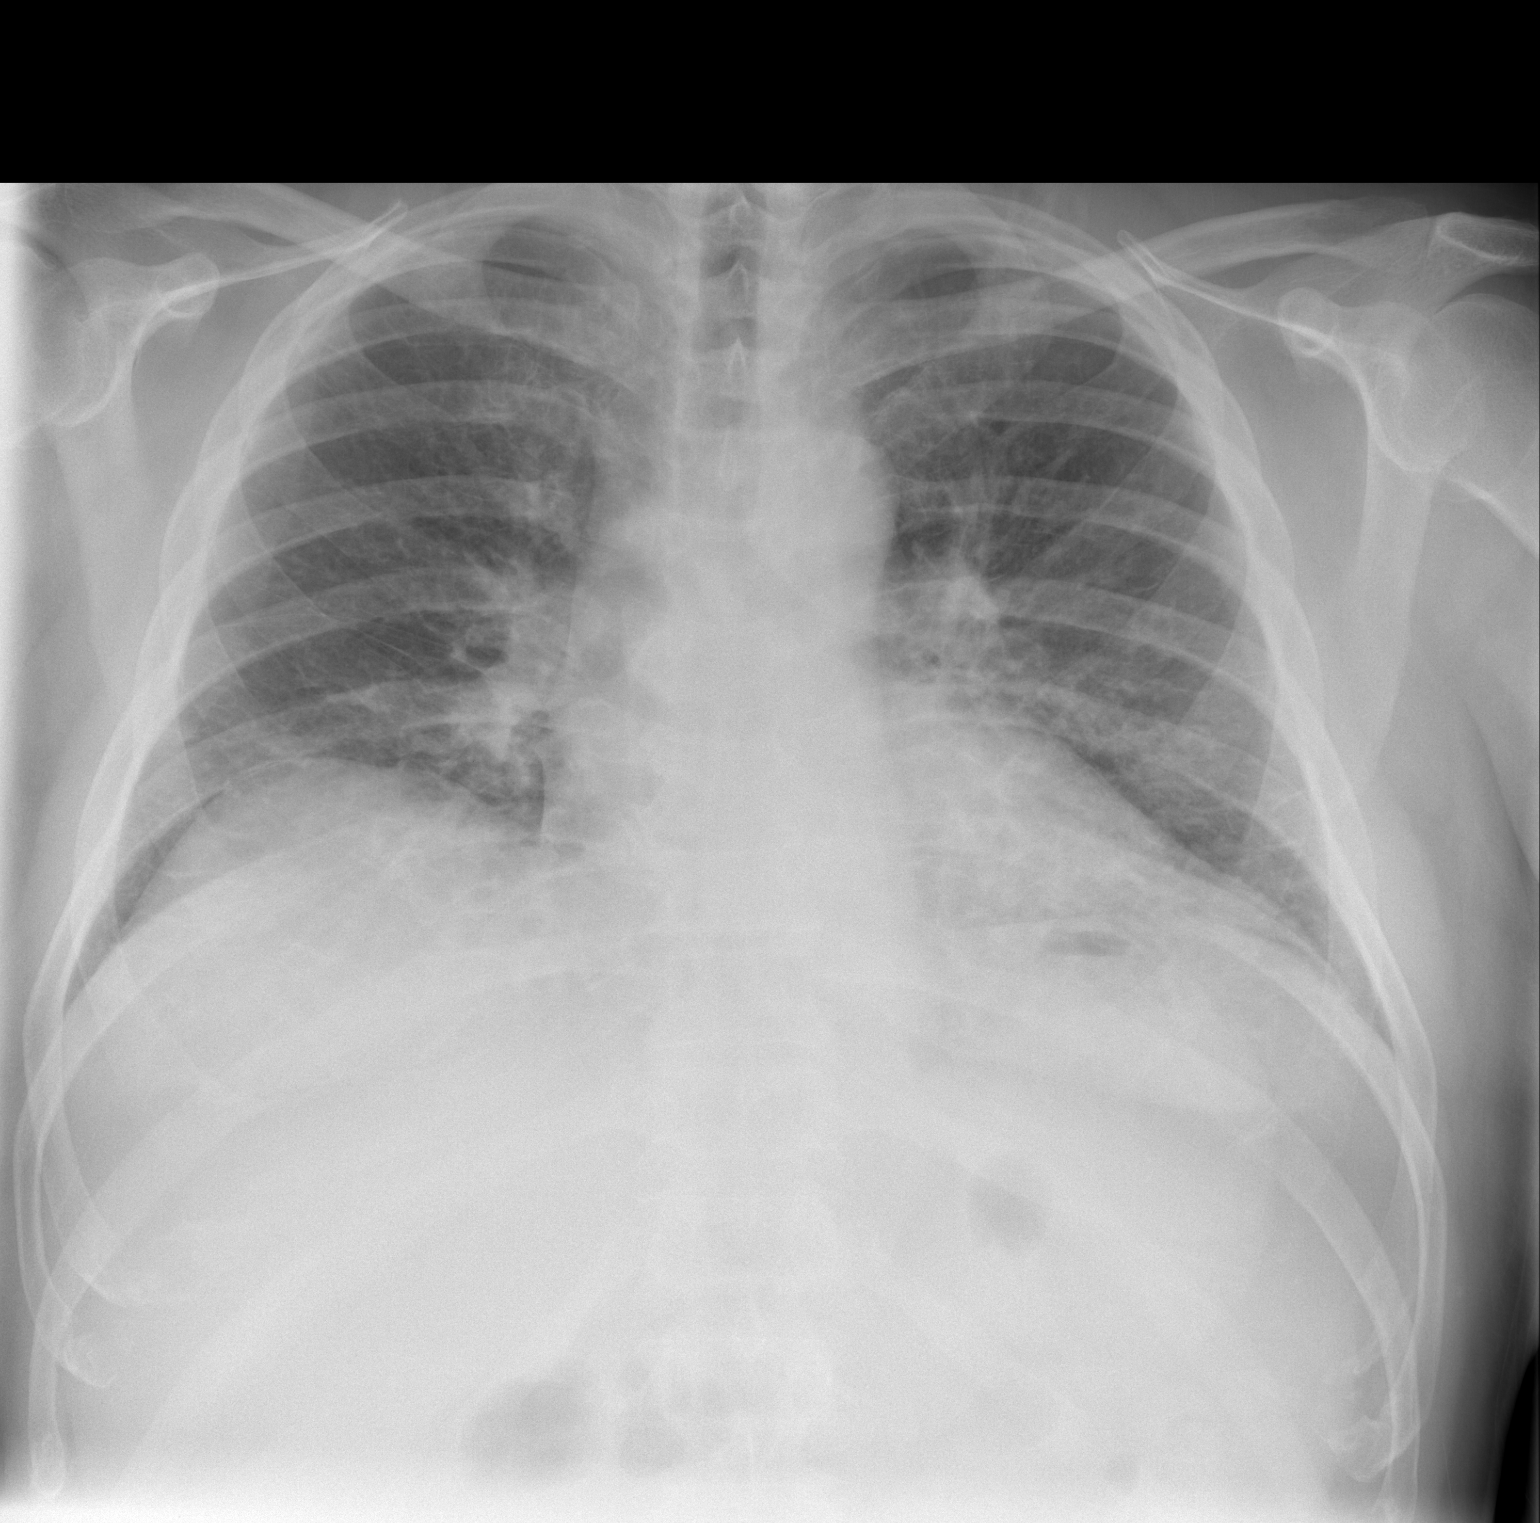

[w chest lat]
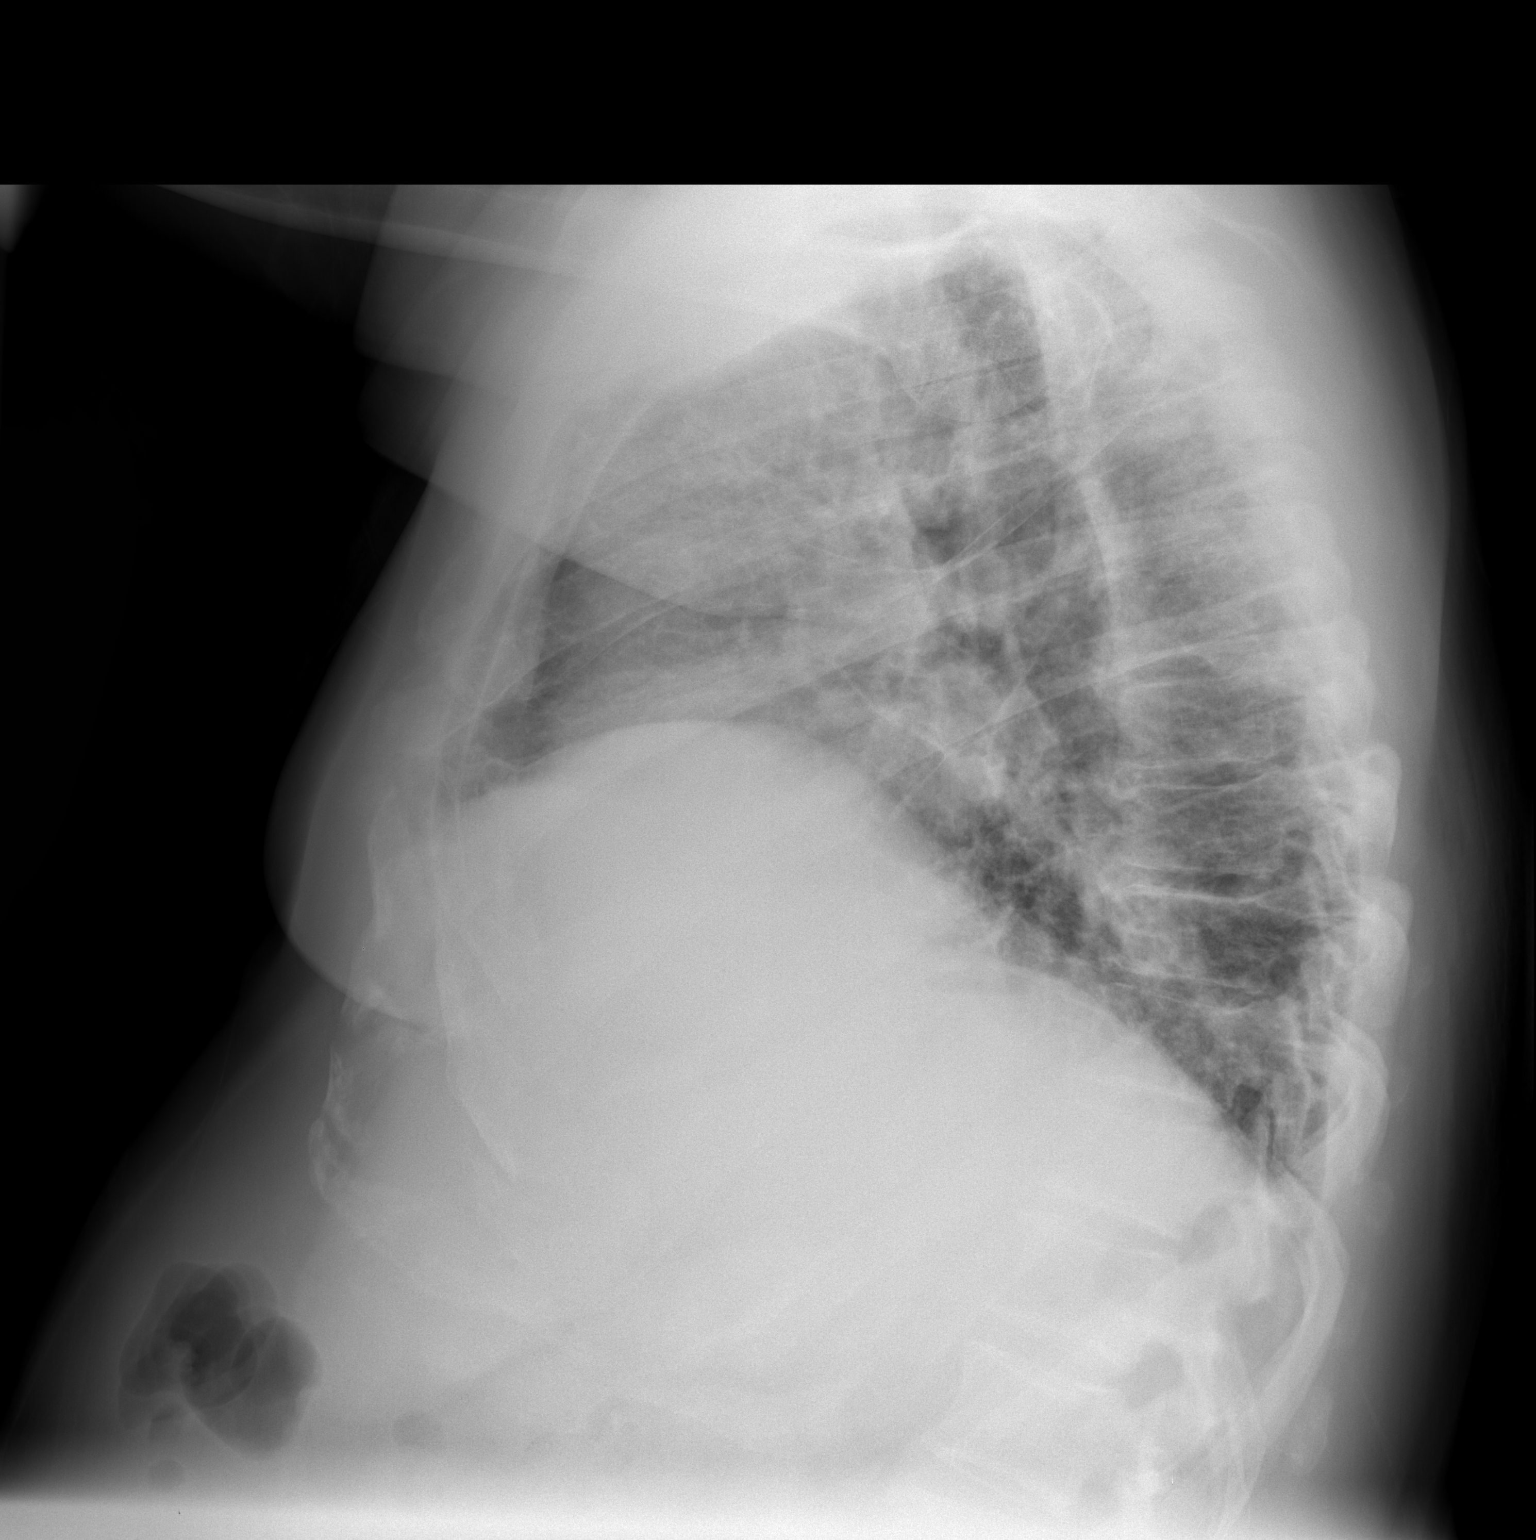

[2 of 2 positions shown; findings below may reference images not displayed]

FINDINGS: Low lung volumes.  Perihilar and bibasilar interstitial
infiltrates or edema.  Heart size upper limits normal.  No
effusion.  Visualized bones unremarkable.
IMPRESSION: 1.  Perihilar and bibasilar interstitial edema or infiltrates.

## 2011-06-19 IMAGING — CR DG CHEST 1V PORT
1 series · 1 of 1 positions shown · non-contrast
Comparison: [DATE]

CLINICAL DATA: Myocardial infarction

PORTABLE CHEST - 1 VIEW

[AP]
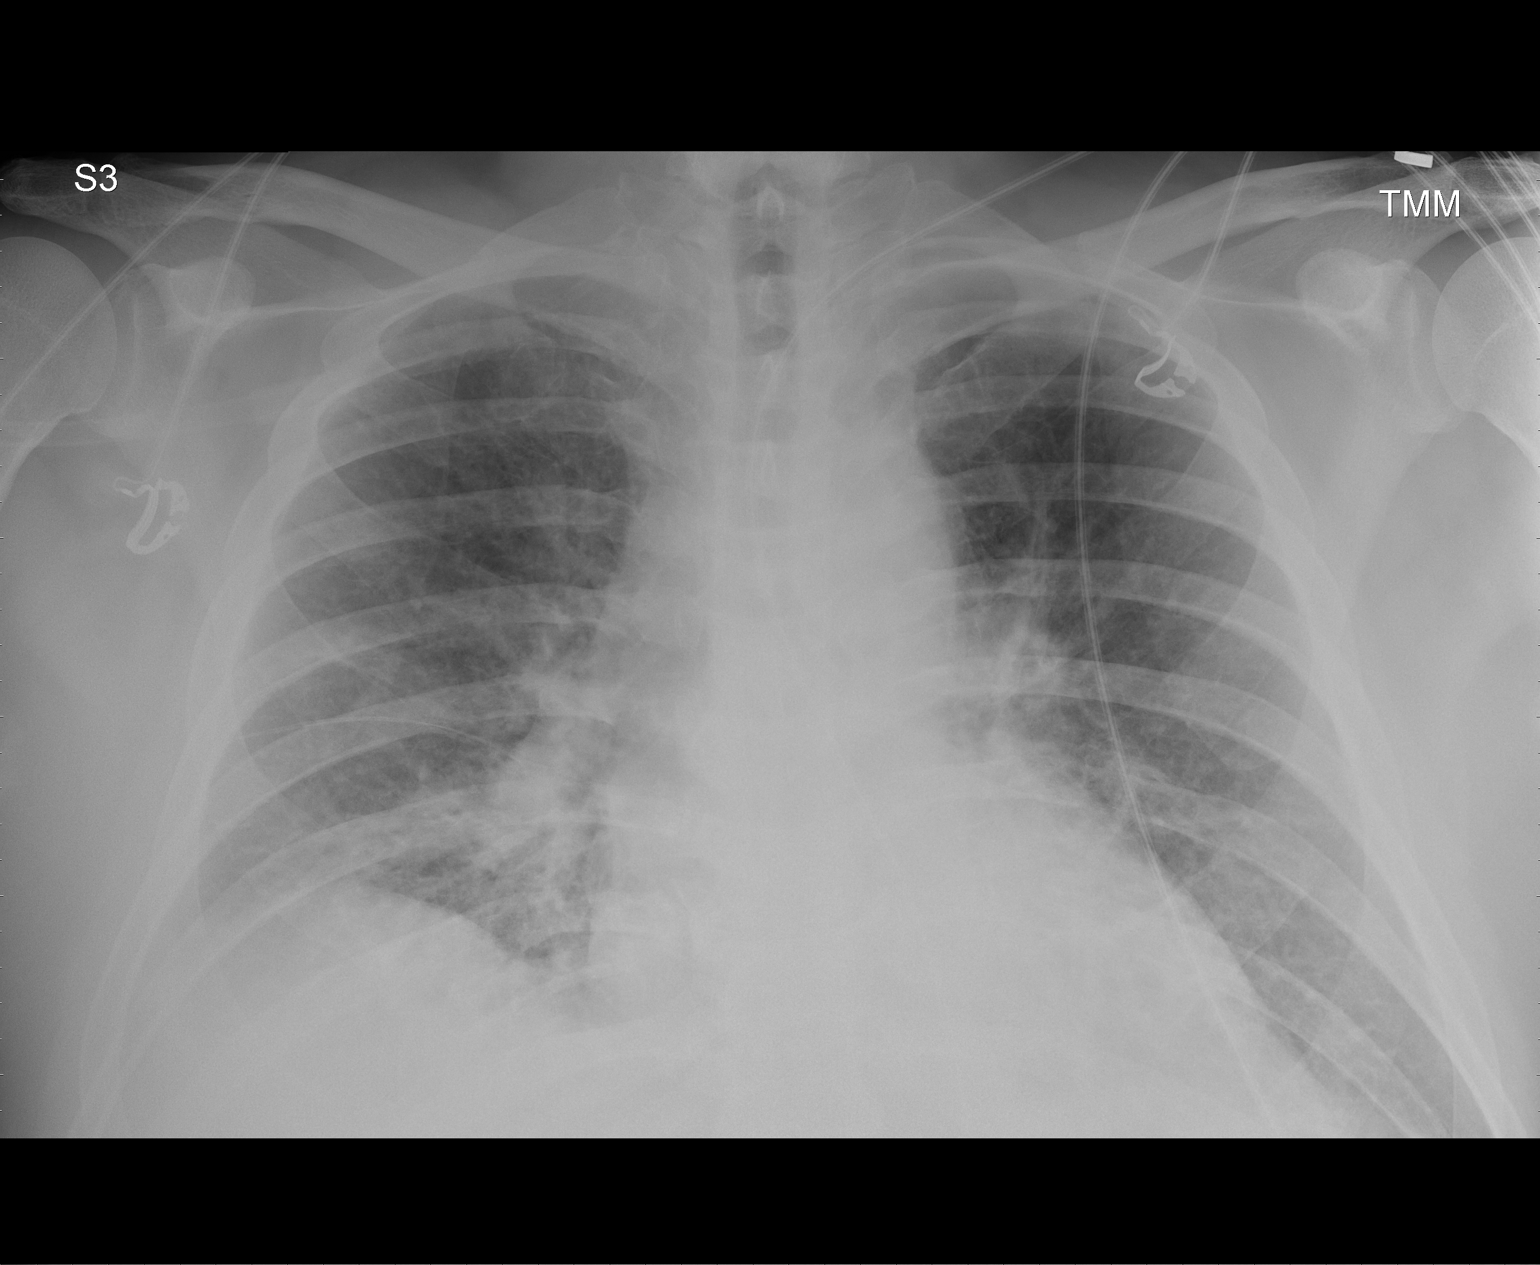

[1 of 1 positions shown; findings below may reference images not displayed]

FINDINGS: Artifact overlies the chest.  The heart is mildly
enlarged.  There is mild interstitial edema.  No alveolar edema.
No focal infiltrate, collapse or apparent effusion.
IMPRESSION: Mild interstitial edema persists.

## 2011-07-09 ENCOUNTER — Ambulatory Visit (INDEPENDENT_AMBULATORY_CARE_PROVIDER_SITE_OTHER): Payer: Medicare Other | Admitting: *Deleted

## 2011-07-09 DIAGNOSIS — I238 Other current complications following acute myocardial infarction: Secondary | ICD-10-CM

## 2011-07-09 DIAGNOSIS — Z7901 Long term (current) use of anticoagulants: Secondary | ICD-10-CM

## 2011-07-09 DIAGNOSIS — I6350 Cerebral infarction due to unspecified occlusion or stenosis of unspecified cerebral artery: Secondary | ICD-10-CM

## 2011-07-14 ENCOUNTER — Other Ambulatory Visit (HOSPITAL_COMMUNITY): Payer: Self-pay | Admitting: Internal Medicine

## 2011-08-01 ENCOUNTER — Other Ambulatory Visit (HOSPITAL_COMMUNITY): Payer: Self-pay | Admitting: Internal Medicine

## 2011-08-17 ENCOUNTER — Other Ambulatory Visit (HOSPITAL_COMMUNITY): Payer: Self-pay | Admitting: Internal Medicine

## 2011-08-18 ENCOUNTER — Other Ambulatory Visit: Payer: Self-pay | Admitting: Internal Medicine

## 2011-08-19 ENCOUNTER — Other Ambulatory Visit: Payer: Self-pay

## 2011-08-19 MED ORDER — SPIRONOLACTONE 25 MG PO TABS: 25.0000 mg | ORAL_TABLET | Freq: Every day | ORAL | Status: DC

## 2011-08-20 ENCOUNTER — Ambulatory Visit (INDEPENDENT_AMBULATORY_CARE_PROVIDER_SITE_OTHER): Payer: Medicare Other | Admitting: *Deleted

## 2011-08-20 DIAGNOSIS — I238 Other current complications following acute myocardial infarction: Secondary | ICD-10-CM

## 2011-08-20 DIAGNOSIS — I6350 Cerebral infarction due to unspecified occlusion or stenosis of unspecified cerebral artery: Secondary | ICD-10-CM

## 2011-08-20 DIAGNOSIS — Z7901 Long term (current) use of anticoagulants: Secondary | ICD-10-CM

## 2011-08-20 LAB — POCT INR: INR: 3

## 2011-09-21 ENCOUNTER — Other Ambulatory Visit (HOSPITAL_COMMUNITY): Payer: Self-pay | Admitting: Internal Medicine

## 2011-10-01 ENCOUNTER — Ambulatory Visit (INDEPENDENT_AMBULATORY_CARE_PROVIDER_SITE_OTHER): Payer: Medicare Other

## 2011-10-01 DIAGNOSIS — I6350 Cerebral infarction due to unspecified occlusion or stenosis of unspecified cerebral artery: Secondary | ICD-10-CM

## 2011-10-01 DIAGNOSIS — I238 Other current complications following acute myocardial infarction: Secondary | ICD-10-CM

## 2011-10-01 DIAGNOSIS — Z7901 Long term (current) use of anticoagulants: Secondary | ICD-10-CM

## 2011-10-01 LAB — POCT INR: INR: 2.4

## 2011-10-30 ENCOUNTER — Other Ambulatory Visit (HOSPITAL_COMMUNITY): Payer: Self-pay | Admitting: Internal Medicine

## 2011-11-11 ENCOUNTER — Ambulatory Visit (HOSPITAL_COMMUNITY)
Admission: RE | Admit: 2011-11-11 | Discharge: 2011-11-11 | Disposition: A | Discharge status: Home / Self Care | Payer: Medicare Other | Source: Ambulatory Visit | Attending: Internal Medicine | Admitting: Internal Medicine

## 2011-11-11 ENCOUNTER — Ambulatory Visit (INDEPENDENT_AMBULATORY_CARE_PROVIDER_SITE_OTHER): Payer: Medicare Other

## 2011-11-11 VITALS — BP 104/52 | HR 54 | Wt 222.8 lb

## 2011-11-11 DIAGNOSIS — Z7901 Long term (current) use of anticoagulants: Secondary | ICD-10-CM

## 2011-11-11 DIAGNOSIS — Z7982 Long term (current) use of aspirin: Secondary | ICD-10-CM | POA: Insufficient documentation

## 2011-11-11 DIAGNOSIS — I5022 Chronic systolic (congestive) heart failure: Secondary | ICD-10-CM

## 2011-11-11 DIAGNOSIS — E119 Type 2 diabetes mellitus without complications: Secondary | ICD-10-CM | POA: Insufficient documentation

## 2011-11-11 DIAGNOSIS — I2510 Atherosclerotic heart disease of native coronary artery without angina pectoris: Secondary | ICD-10-CM

## 2011-11-11 DIAGNOSIS — I6350 Cerebral infarction due to unspecified occlusion or stenosis of unspecified cerebral artery: Secondary | ICD-10-CM

## 2011-11-11 DIAGNOSIS — M109 Gout, unspecified: Secondary | ICD-10-CM | POA: Insufficient documentation

## 2011-11-11 DIAGNOSIS — I509 Heart failure, unspecified: Secondary | ICD-10-CM | POA: Insufficient documentation

## 2011-11-11 DIAGNOSIS — Z8673 Personal history of transient ischemic attack (TIA), and cerebral infarction without residual deficits: Secondary | ICD-10-CM | POA: Insufficient documentation

## 2011-11-11 DIAGNOSIS — I10 Essential (primary) hypertension: Secondary | ICD-10-CM | POA: Insufficient documentation

## 2011-11-11 DIAGNOSIS — I238 Other current complications following acute myocardial infarction: Secondary | ICD-10-CM

## 2011-11-11 DIAGNOSIS — E669 Obesity, unspecified: Secondary | ICD-10-CM | POA: Insufficient documentation

## 2011-11-11 DIAGNOSIS — I252 Old myocardial infarction: Secondary | ICD-10-CM | POA: Insufficient documentation

## 2011-11-11 LAB — POCT INR: INR: 2.5

## 2011-11-11 NOTE — Assessment & Plan Note (Signed)
No evidence of ischemia. Continue current regimen.   

## 2011-11-11 NOTE — Patient Instructions (Addendum)
Follow up in 1 year   Do the following things EVERYDAY: 1) Weigh yourself in the morning before breakfast. Write it down and keep it in a log. 2) Take your medicines as prescribed 3) Eat low salt foods-Limit salt (sodium) to 2000 mg per day.  4) Stay as active as you can everyday 5) Limit all fluids for the day to less than 2 liters 

## 2011-11-11 NOTE — Progress Notes (Signed)
Patient ID: Marcus Ramos, male   DOB: 09/03/1940, 71 y.o.   MRN: [id number] HPI:  Marcus Ramos is a 71 year old with diabetes, HTN who suffered a large anterolateral MI in 6/10 c/b cardiogenic shock. He was treated with emergent bare-metal stenting of the LAD, as well as a staged drug-eluting stent to the circumflex. He had failed percutaneous coronary intervention of the RCA. Ejection fraction was 25% with global hypokinesis, and question of a left ventricular thrombus. He was placed on Coumadin as well as aspirin and Plavix. 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  Had echo 8/10 which confirmed EF 25-30%. MRI EF 23%. With full thickness scar. No signifcant viability.  We previously arranged for him to see Dr. Johney Frame to discuss ICD placement. He saw Dr. Johney Frame and was set-up for an ICD but he cancelled his implant appt saying he didn't want a device at this time.  He returns for follow up. Denies SOB/PND/Orthopnea/CP. Denies bleeding problems. Weight at home 222-223 pounds. Remains active farming. Complaint with medications. Dr Jeannetta Nap follows his cholesterol.   ROS: All systems negative except as listed in HPI, PMH and Problem List.  Past Medical History  Diagnosis Date  . 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 Essential hypertension, benign   . Type II or unspecified type diabetes mellitus without mention of complication, not stated as uncontrolled   . Obesity, unspecified   . Hypopotassemia   . Gout   . Embolic stroke   . Stented coronary artery     Bare-metal stenting of 100% occluded left descending artery. Staged drug eluting stenting of circumflex artery    Current Outpatient Prescriptions  Medication Sig Dispense Refill  . allopurinol (ZYLOPRIM) 100 MG tablet TAKE 1 TABLET DAILY  30 tablet  0  . aspirin 81 MG tablet Take 81 mg by mouth daily.        Marland Kitchen atorvastatin (LIPITOR) 40 MG tablet TAKE 1 TABLET  DAILY (CONTACT FOR OFFICE VISIT)  90 tablet  1  . carvedilol (COREG) 6.25 MG tablet TAKE 1 TABLET TWICE A DAY  180 tablet  1  . colchicine 0.6 MG tablet Take 0.6 mg by mouth as needed.        . enalapril (VASOTEC) 20 MG tablet TAKE ONE-HALF (1/2) TABLET TWICE A DAY  90 tablet  0  . fish oil-omega-3 fatty acids 1000 MG capsule Take 2 g by mouth daily. 2 capsules daily.       . furosemide (LASIX) 20 MG tablet TAKE 1 TABLET EVERY OTHER DAY  90 tablet  2  . metFORMIN (GLUCOPHAGE) 500 MG tablet Take 500 mg by mouth daily.        . nitroGLYCERIN (NITROSTAT) 0.4 MG SL tablet Place 0.4 mg under the tongue every 5 (five) minutes as needed.        Marland Kitchen spironolactone (ALDACTONE) 25 MG tablet Take 1 tablet (25 mg total) by mouth daily.  90 tablet  0  . warfarin (COUMADIN) 5 MG tablet TAKE AS DIRECTED BY ANTICOAGULATION CLINIC  150 tablet  2     PHYSICAL EXAM: Filed Vitals:   11/11/11 0902  BP: 104/52  Pulse: 54   General:  Well appearing. no resp difficulty HEENT: normal Neck: supple. no JVD. Carotids 2+ bilat; no bruits. No lymphadenopathy or thryomegaly appreciated. Cor: PMI laterally displaced. Distant. Regular rate & rhythm. No rubs, gallops, murmur. Lungs: clear Abdomen: soft, nontender, nondistended. No hepatosplenomegaly. No bruits  or masses. Good bowel sounds. + umbilical hernia Extremities: no cyanosis, clubbing, rash, edema Neuro: alert & orientedx3, cranial nerves grossly intact. moves all 4 extremities w/o difficulty. affect pleasant    ECG: Sinus brady 57.    ASSESSMENT & PLAN:

## 2011-11-11 NOTE — Assessment & Plan Note (Signed)
Volume status stable. Continue current regimen. Reinforced daily weights and low salt diet. Follow up in 1 year.

## 2011-11-12 NOTE — Addendum Note (Signed)
Encounter addended by: Cresenciano Genre on: 11/12/2011  7:55 AM<BR>     Documentation filed: Charges VN

## 2011-11-15 ENCOUNTER — Other Ambulatory Visit: Payer: Self-pay | Admitting: Internal Medicine

## 2011-11-16 ENCOUNTER — Other Ambulatory Visit (HOSPITAL_COMMUNITY): Payer: Self-pay | Admitting: Internal Medicine

## 2011-12-20 ENCOUNTER — Other Ambulatory Visit (HOSPITAL_COMMUNITY): Payer: Self-pay | Admitting: Internal Medicine

## 2011-12-24 ENCOUNTER — Ambulatory Visit (INDEPENDENT_AMBULATORY_CARE_PROVIDER_SITE_OTHER): Payer: Medicare Other | Admitting: *Deleted

## 2011-12-24 DIAGNOSIS — I238 Other current complications following acute myocardial infarction: Secondary | ICD-10-CM

## 2011-12-24 DIAGNOSIS — I6350 Cerebral infarction due to unspecified occlusion or stenosis of unspecified cerebral artery: Secondary | ICD-10-CM

## 2011-12-24 DIAGNOSIS — Z7901 Long term (current) use of anticoagulants: Secondary | ICD-10-CM

## 2011-12-24 LAB — POCT INR: INR: 2.5

## 2011-12-30 ENCOUNTER — Other Ambulatory Visit: Payer: Self-pay | Admitting: Internal Medicine

## 2011-12-30 DIAGNOSIS — M109 Gout, unspecified: Secondary | ICD-10-CM

## 2011-12-30 NOTE — Telephone Encounter (Signed)
90 Day supply

## 2012-01-01 MED ORDER — ALLOPURINOL 100 MG PO TABS: 100.0000 mg | ORAL_TABLET | Freq: Every day | ORAL | Status: DC

## 2012-01-01 NOTE — Telephone Encounter (Signed)
Last ov 11/11/11. Refills approved

## 2012-01-06 ENCOUNTER — Other Ambulatory Visit: Payer: Self-pay | Admitting: Internal Medicine

## 2012-02-04 ENCOUNTER — Ambulatory Visit (INDEPENDENT_AMBULATORY_CARE_PROVIDER_SITE_OTHER): Payer: Medicare Other | Admitting: *Deleted

## 2012-02-04 DIAGNOSIS — Z7901 Long term (current) use of anticoagulants: Secondary | ICD-10-CM

## 2012-02-04 DIAGNOSIS — I238 Other current complications following acute myocardial infarction: Secondary | ICD-10-CM

## 2012-02-04 DIAGNOSIS — I6350 Cerebral infarction due to unspecified occlusion or stenosis of unspecified cerebral artery: Secondary | ICD-10-CM

## 2012-02-04 LAB — POCT INR: INR: 2.7

## 2012-03-17 ENCOUNTER — Ambulatory Visit (INDEPENDENT_AMBULATORY_CARE_PROVIDER_SITE_OTHER): Payer: Medicare Other

## 2012-03-17 ENCOUNTER — Other Ambulatory Visit: Payer: Self-pay | Admitting: Internal Medicine

## 2012-03-17 DIAGNOSIS — I6350 Cerebral infarction due to unspecified occlusion or stenosis of unspecified cerebral artery: Secondary | ICD-10-CM

## 2012-03-17 DIAGNOSIS — Z7901 Long term (current) use of anticoagulants: Secondary | ICD-10-CM

## 2012-03-17 DIAGNOSIS — I238 Other current complications following acute myocardial infarction: Secondary | ICD-10-CM

## 2012-04-04 ENCOUNTER — Other Ambulatory Visit: Payer: Self-pay | Admitting: Internal Medicine

## 2012-04-28 ENCOUNTER — Ambulatory Visit (INDEPENDENT_AMBULATORY_CARE_PROVIDER_SITE_OTHER): Payer: Medicare Other

## 2012-04-28 DIAGNOSIS — I238 Other current complications following acute myocardial infarction: Secondary | ICD-10-CM

## 2012-04-28 DIAGNOSIS — I6350 Cerebral infarction due to unspecified occlusion or stenosis of unspecified cerebral artery: Secondary | ICD-10-CM

## 2012-04-28 DIAGNOSIS — Z7901 Long term (current) use of anticoagulants: Secondary | ICD-10-CM

## 2012-06-09 ENCOUNTER — Ambulatory Visit (INDEPENDENT_AMBULATORY_CARE_PROVIDER_SITE_OTHER): Payer: Medicare Other | Admitting: *Deleted

## 2012-06-09 DIAGNOSIS — I238 Other current complications following acute myocardial infarction: Secondary | ICD-10-CM

## 2012-06-09 DIAGNOSIS — Z7901 Long term (current) use of anticoagulants: Secondary | ICD-10-CM

## 2012-06-09 DIAGNOSIS — I6350 Cerebral infarction due to unspecified occlusion or stenosis of unspecified cerebral artery: Secondary | ICD-10-CM

## 2012-06-09 LAB — POCT INR: INR: 3.1

## 2012-06-12 ENCOUNTER — Other Ambulatory Visit: Payer: Self-pay | Admitting: Internal Medicine

## 2012-07-21 ENCOUNTER — Ambulatory Visit (INDEPENDENT_AMBULATORY_CARE_PROVIDER_SITE_OTHER): Payer: Medicare Other | Admitting: *Deleted

## 2012-07-21 DIAGNOSIS — Z7901 Long term (current) use of anticoagulants: Secondary | ICD-10-CM

## 2012-07-21 DIAGNOSIS — I6350 Cerebral infarction due to unspecified occlusion or stenosis of unspecified cerebral artery: Secondary | ICD-10-CM

## 2012-07-21 DIAGNOSIS — I238 Other current complications following acute myocardial infarction: Secondary | ICD-10-CM

## 2012-08-04 ENCOUNTER — Ambulatory Visit (INDEPENDENT_AMBULATORY_CARE_PROVIDER_SITE_OTHER): Payer: Medicare Other | Admitting: *Deleted

## 2012-08-04 DIAGNOSIS — I238 Other current complications following acute myocardial infarction: Secondary | ICD-10-CM

## 2012-08-04 DIAGNOSIS — Z7901 Long term (current) use of anticoagulants: Secondary | ICD-10-CM

## 2012-08-04 DIAGNOSIS — I6350 Cerebral infarction due to unspecified occlusion or stenosis of unspecified cerebral artery: Secondary | ICD-10-CM

## 2012-08-13 ENCOUNTER — Other Ambulatory Visit: Payer: Self-pay | Admitting: Internal Medicine

## 2012-09-01 ENCOUNTER — Ambulatory Visit (INDEPENDENT_AMBULATORY_CARE_PROVIDER_SITE_OTHER): Payer: Medicare Other | Admitting: *Deleted

## 2012-09-01 DIAGNOSIS — I6350 Cerebral infarction due to unspecified occlusion or stenosis of unspecified cerebral artery: Secondary | ICD-10-CM

## 2012-09-01 DIAGNOSIS — Z7901 Long term (current) use of anticoagulants: Secondary | ICD-10-CM

## 2012-09-01 DIAGNOSIS — I238 Other current complications following acute myocardial infarction: Secondary | ICD-10-CM

## 2012-09-01 LAB — POCT INR: INR: 3.9

## 2012-09-22 ENCOUNTER — Ambulatory Visit (INDEPENDENT_AMBULATORY_CARE_PROVIDER_SITE_OTHER): Payer: Medicare Other | Admitting: *Deleted

## 2012-09-22 DIAGNOSIS — Z7901 Long term (current) use of anticoagulants: Secondary | ICD-10-CM

## 2012-09-22 DIAGNOSIS — I6350 Cerebral infarction due to unspecified occlusion or stenosis of unspecified cerebral artery: Secondary | ICD-10-CM

## 2012-09-22 DIAGNOSIS — I238 Other current complications following acute myocardial infarction: Secondary | ICD-10-CM

## 2012-10-05 ENCOUNTER — Other Ambulatory Visit: Payer: Self-pay | Admitting: Internal Medicine

## 2012-10-15 ENCOUNTER — Other Ambulatory Visit: Payer: Self-pay | Admitting: Internal Medicine

## 2012-10-18 ENCOUNTER — Other Ambulatory Visit: Payer: Self-pay | Admitting: Internal Medicine

## 2012-10-20 ENCOUNTER — Ambulatory Visit (INDEPENDENT_AMBULATORY_CARE_PROVIDER_SITE_OTHER): Payer: Medicare Other | Admitting: *Deleted

## 2012-10-20 DIAGNOSIS — I6350 Cerebral infarction due to unspecified occlusion or stenosis of unspecified cerebral artery: Secondary | ICD-10-CM

## 2012-10-20 DIAGNOSIS — Z7901 Long term (current) use of anticoagulants: Secondary | ICD-10-CM

## 2012-10-20 DIAGNOSIS — I238 Other current complications following acute myocardial infarction: Secondary | ICD-10-CM

## 2012-10-29 ENCOUNTER — Encounter: Payer: Self-pay | Admitting: Internal Medicine

## 2012-11-17 ENCOUNTER — Encounter (HOSPITAL_COMMUNITY): Payer: Self-pay

## 2012-11-17 ENCOUNTER — Ambulatory Visit (INDEPENDENT_AMBULATORY_CARE_PROVIDER_SITE_OTHER): Payer: Medicare Other | Admitting: *Deleted

## 2012-11-17 ENCOUNTER — Ambulatory Visit (HOSPITAL_COMMUNITY)
Admission: RE | Admit: 2012-11-17 | Discharge: 2012-11-17 | Disposition: A | Discharge status: Home / Self Care | Payer: Medicare Other | Source: Ambulatory Visit | Attending: Internal Medicine | Admitting: Internal Medicine

## 2012-11-17 VITALS — BP 120/68 | HR 56 | Wt 225.8 lb

## 2012-11-17 DIAGNOSIS — I259 Chronic ischemic heart disease, unspecified: Secondary | ICD-10-CM | POA: Insufficient documentation

## 2012-11-17 DIAGNOSIS — M109 Gout, unspecified: Secondary | ICD-10-CM | POA: Insufficient documentation

## 2012-11-17 DIAGNOSIS — I2510 Atherosclerotic heart disease of native coronary artery without angina pectoris: Secondary | ICD-10-CM | POA: Insufficient documentation

## 2012-11-17 DIAGNOSIS — I238 Other current complications following acute myocardial infarction: Secondary | ICD-10-CM

## 2012-11-17 DIAGNOSIS — I6350 Cerebral infarction due to unspecified occlusion or stenosis of unspecified cerebral artery: Secondary | ICD-10-CM

## 2012-11-17 DIAGNOSIS — I5022 Chronic systolic (congestive) heart failure: Secondary | ICD-10-CM

## 2012-11-17 DIAGNOSIS — I10 Essential (primary) hypertension: Secondary | ICD-10-CM | POA: Insufficient documentation

## 2012-11-17 DIAGNOSIS — Z79899 Other long term (current) drug therapy: Secondary | ICD-10-CM | POA: Insufficient documentation

## 2012-11-17 DIAGNOSIS — Z9861 Coronary angioplasty status: Secondary | ICD-10-CM | POA: Insufficient documentation

## 2012-11-17 DIAGNOSIS — E785 Hyperlipidemia, unspecified: Secondary | ICD-10-CM

## 2012-11-17 DIAGNOSIS — E669 Obesity, unspecified: Secondary | ICD-10-CM | POA: Insufficient documentation

## 2012-11-17 DIAGNOSIS — E119 Type 2 diabetes mellitus without complications: Secondary | ICD-10-CM | POA: Insufficient documentation

## 2012-11-17 DIAGNOSIS — Z7901 Long term (current) use of anticoagulants: Secondary | ICD-10-CM

## 2012-11-17 DIAGNOSIS — Z8673 Personal history of transient ischemic attack (TIA), and cerebral infarction without residual deficits: Secondary | ICD-10-CM | POA: Insufficient documentation

## 2012-11-17 NOTE — Patient Instructions (Addendum)
The current medical regimen is effective;  continue present plan and medications.  Your physician has requested that you have an echocardiogram. Echocardiography is a painless test that uses sound waves to create images of your heart. It provides your doctor with information about the size and shape of your heart and how well your heart's chambers and valves are working. This procedure takes approximately one hour. There are no restrictions for this procedure.  Follow up in 1 year

## 2012-11-17 NOTE — Progress Notes (Signed)
Patient ID: Marcus Ramos, male   DOB: 31-Jul-1940, 72 y.o.   MRN: [id number] HPI: Marcus Ramos is a 72 year old with diabetes, HTN who suffered a large anterolateral MI in 6/10 c/b cardiogenic shock. He was treated with emergent bare-metal stenting of the LAD, as well as a staged drug-eluting stent to the circumflex. S/P PTCA RCA. Ejection fraction was 25% with global hypokinesis, and question of a left ventricular thrombus. He was placed on Coumadin as well as aspirin and Plavix. 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  Had echo 8/10 which confirmed EF 25-30%. MRI EF 23%. With full thickness scar. No signifcant viability.  We previously arranged for him to see Dr. Johney Ramos to discuss ICD placement. He saw Dr. Johney Ramos and was set-up for an ICD but he cancelled his implant appt saying he didn't want a device at this time.  He returns for follow up. Denies SOB/PND/Orthopnea/CP. Denies bleeding problems. He is not weighing regularly.  Remains active gardening. Excellent appetite. Complaint with medications. Dr Jeannetta Nap checked cholesterol 2 weeks ago.    ROS: All systems negative except as listed in HPI, PMH and Problem List.  Past Medical History  Diagnosis Date  . 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 Essential hypertension, benign   . Type II or unspecified type diabetes mellitus without mention of complication, not stated as uncontrolled   . Obesity, unspecified   . Hypopotassemia   . Gout   . Embolic stroke   . Stented coronary artery     Bare-metal stenting of 100% occluded left descending artery. Staged drug eluting stenting of circumflex artery    Current Outpatient Prescriptions  Medication Sig Dispense Refill  . allopurinol (ZYLOPRIM) 100 MG tablet TAKE 1 TABLET DAILY  90 tablet  1  . aspirin 81 MG tablet Take 81 mg by mouth daily.        Marland Kitchen atorvastatin (LIPITOR) 40 MG tablet Take 1 tablet (40 mg total) by mouth  daily.  90 tablet  3  . carvedilol (COREG) 6.25 MG tablet TAKE 1 TABLET TWICE A DAY  180 tablet  3  . colchicine 0.6 MG tablet Take 0.6 mg by mouth as needed.        . enalapril (VASOTEC) 20 MG tablet TAKE ONE-HALF (1/2) TABLET TWICE A DAY  90 tablet  2  . fish oil-omega-3 fatty acids 1000 MG capsule Take 2 g by mouth daily. 2 capsules daily.       . furosemide (LASIX) 20 MG tablet TAKE 1 TABLET EVERY OTHER DAY  90 tablet  3  . nitroGLYCERIN (NITROSTAT) 0.4 MG SL tablet Place 0.4 mg under the tongue every 5 (five) minutes as needed.        Marland Kitchen spironolactone (ALDACTONE) 25 MG tablet TAKE 1 TABLET DAILY  90 tablet  2  . warfarin (COUMADIN) 5 MG tablet Take 1 tablet (5 mg total) by mouth as directed.  150 tablet  1  . metFORMIN (GLUCOPHAGE) 500 MG tablet Take 500 mg by mouth daily.         No current facility-administered medications for this encounter.     PHYSICAL EXAM: Filed Vitals:   11/17/12 0836  BP: 120/68  Pulse: 56   General:  Well appearing. no resp difficulty HEENT: normal Neck: supple. no JVD. Carotids 2+ bilat; no bruits. No lymphadenopathy or thryomegaly appreciated. Cor: PMI laterally displaced. Distant. Regular rate & rhythm. No rubs, gallops, murmur. Lungs: clear  Abdomen: soft, nontender, nondistended. No hepatosplenomegaly. No bruits or masses. Good bowel sounds. + umbilical hernia Extremities: no cyanosis, clubbing, rash, edema Neuro: alert & orientedx3, cranial nerves grossly intact. moves all 4 extremities w/o difficulty. affect pleasant        ASSESSMENT & PLAN: 1. Chronic Systolic Heart Failure- ECHO EF 30% 11/2010 NYHA I. Volume status stable. Continue lasix 20 mg every other day.  Continue carvedilol 6.25 mg twice a day. Continue Enalapril 10 mg daily.  Refuses ICD. Check ECHO   2. Hyperlipidemia Continue Lipitor 40 mg daily Managed by Dr Jeannetta Nap  3. Mural Thrombus As noted full thickness LV scar which is high risk for recurrent clot. Continue coumadin.  INR followed [facility].    Follow up in 1 year.

## 2012-11-18 ENCOUNTER — Other Ambulatory Visit: Payer: Self-pay | Admitting: Internal Medicine

## 2012-12-15 ENCOUNTER — Ambulatory Visit (HOSPITAL_COMMUNITY): Payer: Medicare Other | Attending: Cardiovascular Disease | Admitting: Radiology

## 2012-12-15 ENCOUNTER — Ambulatory Visit (INDEPENDENT_AMBULATORY_CARE_PROVIDER_SITE_OTHER): Payer: Medicare Other | Admitting: *Deleted

## 2012-12-15 DIAGNOSIS — I509 Heart failure, unspecified: Secondary | ICD-10-CM | POA: Insufficient documentation

## 2012-12-15 DIAGNOSIS — E119 Type 2 diabetes mellitus without complications: Secondary | ICD-10-CM | POA: Insufficient documentation

## 2012-12-15 DIAGNOSIS — E785 Hyperlipidemia, unspecified: Secondary | ICD-10-CM | POA: Insufficient documentation

## 2012-12-15 DIAGNOSIS — I359 Nonrheumatic aortic valve disorder, unspecified: Secondary | ICD-10-CM | POA: Insufficient documentation

## 2012-12-15 DIAGNOSIS — I5022 Chronic systolic (congestive) heart failure: Secondary | ICD-10-CM

## 2012-12-15 DIAGNOSIS — I238 Other current complications following acute myocardial infarction: Secondary | ICD-10-CM

## 2012-12-15 DIAGNOSIS — I5020 Unspecified systolic (congestive) heart failure: Secondary | ICD-10-CM | POA: Insufficient documentation

## 2012-12-15 DIAGNOSIS — Z7901 Long term (current) use of anticoagulants: Secondary | ICD-10-CM

## 2012-12-15 DIAGNOSIS — E669 Obesity, unspecified: Secondary | ICD-10-CM | POA: Insufficient documentation

## 2012-12-15 DIAGNOSIS — I2510 Atherosclerotic heart disease of native coronary artery without angina pectoris: Secondary | ICD-10-CM | POA: Insufficient documentation

## 2012-12-15 DIAGNOSIS — I2589 Other forms of chronic ischemic heart disease: Secondary | ICD-10-CM | POA: Insufficient documentation

## 2012-12-15 DIAGNOSIS — I6350 Cerebral infarction due to unspecified occlusion or stenosis of unspecified cerebral artery: Secondary | ICD-10-CM

## 2012-12-15 DIAGNOSIS — I10 Essential (primary) hypertension: Secondary | ICD-10-CM | POA: Insufficient documentation

## 2012-12-15 MED ORDER — PERFLUTREN PROTEIN A MICROSPH IV SUSP
2.0000 mL | Freq: Once | INTRAVENOUS | Status: AC
  Administered 2012-12-15: 2 mL via INTRAVENOUS

## 2012-12-15 NOTE — Addendum Note (Signed)
Addended by: Oren Bracket on: 12/15/2012 10:20 AM   Modules accepted: Orders

## 2012-12-15 NOTE — Progress Notes (Addendum)
Echocardiogram performed with optison.  

## 2013-01-26 ENCOUNTER — Ambulatory Visit (INDEPENDENT_AMBULATORY_CARE_PROVIDER_SITE_OTHER): Payer: Medicare Other | Admitting: General Practice

## 2013-01-26 DIAGNOSIS — I238 Other current complications following acute myocardial infarction: Secondary | ICD-10-CM

## 2013-01-26 DIAGNOSIS — Z7901 Long term (current) use of anticoagulants: Secondary | ICD-10-CM

## 2013-01-26 DIAGNOSIS — I6350 Cerebral infarction due to unspecified occlusion or stenosis of unspecified cerebral artery: Secondary | ICD-10-CM

## 2013-02-10 ENCOUNTER — Other Ambulatory Visit: Payer: Self-pay | Admitting: Internal Medicine

## 2013-02-15 ENCOUNTER — Other Ambulatory Visit: Payer: Self-pay | Admitting: Internal Medicine

## 2013-03-01 ENCOUNTER — Ambulatory Visit (INDEPENDENT_AMBULATORY_CARE_PROVIDER_SITE_OTHER): Payer: Medicare Other | Admitting: *Deleted

## 2013-03-01 DIAGNOSIS — Z7901 Long term (current) use of anticoagulants: Secondary | ICD-10-CM

## 2013-03-01 DIAGNOSIS — I6350 Cerebral infarction due to unspecified occlusion or stenosis of unspecified cerebral artery: Secondary | ICD-10-CM

## 2013-03-01 DIAGNOSIS — I238 Other current complications following acute myocardial infarction: Secondary | ICD-10-CM

## 2013-04-12 ENCOUNTER — Ambulatory Visit (INDEPENDENT_AMBULATORY_CARE_PROVIDER_SITE_OTHER): Payer: Medicare Other

## 2013-04-12 DIAGNOSIS — I238 Other current complications following acute myocardial infarction: Secondary | ICD-10-CM

## 2013-04-12 DIAGNOSIS — Z7901 Long term (current) use of anticoagulants: Secondary | ICD-10-CM

## 2013-04-12 DIAGNOSIS — I6350 Cerebral infarction due to unspecified occlusion or stenosis of unspecified cerebral artery: Secondary | ICD-10-CM

## 2013-04-25 ENCOUNTER — Other Ambulatory Visit: Payer: Self-pay | Admitting: Internal Medicine

## 2013-05-24 ENCOUNTER — Ambulatory Visit (INDEPENDENT_AMBULATORY_CARE_PROVIDER_SITE_OTHER): Payer: Medicare Other

## 2013-05-24 DIAGNOSIS — I6350 Cerebral infarction due to unspecified occlusion or stenosis of unspecified cerebral artery: Secondary | ICD-10-CM

## 2013-05-24 DIAGNOSIS — Z7901 Long term (current) use of anticoagulants: Secondary | ICD-10-CM

## 2013-05-24 DIAGNOSIS — I238 Other current complications following acute myocardial infarction: Secondary | ICD-10-CM

## 2013-05-24 LAB — POCT INR: INR: 3.2

## 2013-06-09 ENCOUNTER — Other Ambulatory Visit: Payer: Self-pay | Admitting: Internal Medicine

## 2013-06-16 ENCOUNTER — Other Ambulatory Visit: Payer: Self-pay | Admitting: Internal Medicine

## 2013-06-21 ENCOUNTER — Ambulatory Visit (INDEPENDENT_AMBULATORY_CARE_PROVIDER_SITE_OTHER): Payer: Medicare Other

## 2013-06-21 DIAGNOSIS — I238 Other current complications following acute myocardial infarction: Secondary | ICD-10-CM

## 2013-06-21 DIAGNOSIS — Z5181 Encounter for therapeutic drug level monitoring: Secondary | ICD-10-CM

## 2013-06-21 DIAGNOSIS — Z7901 Long term (current) use of anticoagulants: Secondary | ICD-10-CM

## 2013-06-21 DIAGNOSIS — I6350 Cerebral infarction due to unspecified occlusion or stenosis of unspecified cerebral artery: Secondary | ICD-10-CM

## 2013-06-21 LAB — POCT INR: INR: 2.5

## 2013-06-23 ENCOUNTER — Other Ambulatory Visit: Payer: Self-pay | Admitting: Internal Medicine

## 2013-07-19 ENCOUNTER — Ambulatory Visit (INDEPENDENT_AMBULATORY_CARE_PROVIDER_SITE_OTHER): Payer: Medicare Other | Admitting: *Deleted

## 2013-07-19 DIAGNOSIS — I238 Other current complications following acute myocardial infarction: Secondary | ICD-10-CM

## 2013-07-19 DIAGNOSIS — Z7901 Long term (current) use of anticoagulants: Secondary | ICD-10-CM

## 2013-07-19 DIAGNOSIS — Z5181 Encounter for therapeutic drug level monitoring: Secondary | ICD-10-CM

## 2013-07-19 DIAGNOSIS — I6350 Cerebral infarction due to unspecified occlusion or stenosis of unspecified cerebral artery: Secondary | ICD-10-CM

## 2013-07-19 LAB — POCT INR: INR: 2.9

## 2013-07-23 ENCOUNTER — Other Ambulatory Visit: Payer: Self-pay | Admitting: Internal Medicine

## 2013-08-16 ENCOUNTER — Ambulatory Visit (INDEPENDENT_AMBULATORY_CARE_PROVIDER_SITE_OTHER): Payer: Medicare Other | Admitting: *Deleted

## 2013-08-16 DIAGNOSIS — I6350 Cerebral infarction due to unspecified occlusion or stenosis of unspecified cerebral artery: Secondary | ICD-10-CM

## 2013-08-16 DIAGNOSIS — I238 Other current complications following acute myocardial infarction: Secondary | ICD-10-CM

## 2013-08-16 DIAGNOSIS — Z5181 Encounter for therapeutic drug level monitoring: Secondary | ICD-10-CM

## 2013-08-16 DIAGNOSIS — Z7901 Long term (current) use of anticoagulants: Secondary | ICD-10-CM

## 2013-08-16 LAB — POCT INR: INR: 3

## 2013-08-25 ENCOUNTER — Other Ambulatory Visit: Payer: Self-pay | Admitting: Internal Medicine

## 2013-09-16 ENCOUNTER — Ambulatory Visit (INDEPENDENT_AMBULATORY_CARE_PROVIDER_SITE_OTHER): Payer: Medicare Other

## 2013-09-16 DIAGNOSIS — I6350 Cerebral infarction due to unspecified occlusion or stenosis of unspecified cerebral artery: Secondary | ICD-10-CM

## 2013-09-16 DIAGNOSIS — Z7901 Long term (current) use of anticoagulants: Secondary | ICD-10-CM

## 2013-09-16 DIAGNOSIS — I238 Other current complications following acute myocardial infarction: Secondary | ICD-10-CM

## 2013-09-16 DIAGNOSIS — Z5181 Encounter for therapeutic drug level monitoring: Secondary | ICD-10-CM

## 2013-09-16 LAB — POCT INR: INR: 3.2

## 2013-10-14 ENCOUNTER — Ambulatory Visit (INDEPENDENT_AMBULATORY_CARE_PROVIDER_SITE_OTHER): Payer: Medicare Other

## 2013-10-14 DIAGNOSIS — Z7901 Long term (current) use of anticoagulants: Secondary | ICD-10-CM

## 2013-10-14 DIAGNOSIS — I6350 Cerebral infarction due to unspecified occlusion or stenosis of unspecified cerebral artery: Secondary | ICD-10-CM

## 2013-10-14 DIAGNOSIS — I238 Other current complications following acute myocardial infarction: Secondary | ICD-10-CM

## 2013-10-14 DIAGNOSIS — Z5181 Encounter for therapeutic drug level monitoring: Secondary | ICD-10-CM

## 2013-10-14 LAB — POCT INR: INR: 3.4

## 2013-10-15 ENCOUNTER — Other Ambulatory Visit: Payer: Self-pay | Admitting: Internal Medicine

## 2013-11-04 ENCOUNTER — Ambulatory Visit (INDEPENDENT_AMBULATORY_CARE_PROVIDER_SITE_OTHER): Payer: Medicare Other | Admitting: *Deleted

## 2013-11-04 DIAGNOSIS — I6350 Cerebral infarction due to unspecified occlusion or stenosis of unspecified cerebral artery: Secondary | ICD-10-CM

## 2013-11-04 DIAGNOSIS — I238 Other current complications following acute myocardial infarction: Secondary | ICD-10-CM

## 2013-11-04 DIAGNOSIS — Z5181 Encounter for therapeutic drug level monitoring: Secondary | ICD-10-CM

## 2013-11-04 DIAGNOSIS — Z7901 Long term (current) use of anticoagulants: Secondary | ICD-10-CM

## 2013-11-04 LAB — POCT INR: INR: 2.7

## 2013-12-02 ENCOUNTER — Ambulatory Visit (INDEPENDENT_AMBULATORY_CARE_PROVIDER_SITE_OTHER): Payer: Medicare Other | Admitting: *Deleted

## 2013-12-02 DIAGNOSIS — I238 Other current complications following acute myocardial infarction: Secondary | ICD-10-CM

## 2013-12-02 DIAGNOSIS — Z7901 Long term (current) use of anticoagulants: Secondary | ICD-10-CM

## 2013-12-02 DIAGNOSIS — I6350 Cerebral infarction due to unspecified occlusion or stenosis of unspecified cerebral artery: Secondary | ICD-10-CM

## 2013-12-02 DIAGNOSIS — Z5181 Encounter for therapeutic drug level monitoring: Secondary | ICD-10-CM

## 2013-12-02 LAB — POCT INR: INR: 3.1

## 2013-12-30 ENCOUNTER — Ambulatory Visit (INDEPENDENT_AMBULATORY_CARE_PROVIDER_SITE_OTHER): Payer: Medicare Other | Admitting: *Deleted

## 2013-12-30 DIAGNOSIS — I238 Other current complications following acute myocardial infarction: Secondary | ICD-10-CM

## 2013-12-30 DIAGNOSIS — Z7901 Long term (current) use of anticoagulants: Secondary | ICD-10-CM

## 2013-12-30 DIAGNOSIS — Z5181 Encounter for therapeutic drug level monitoring: Secondary | ICD-10-CM

## 2013-12-30 DIAGNOSIS — I6350 Cerebral infarction due to unspecified occlusion or stenosis of unspecified cerebral artery: Secondary | ICD-10-CM

## 2013-12-30 LAB — POCT INR: INR: 2.8

## 2014-01-07 ENCOUNTER — Encounter (HOSPITAL_COMMUNITY): Payer: Self-pay | Admitting: Vascular Surgery

## 2014-01-27 ENCOUNTER — Ambulatory Visit (INDEPENDENT_AMBULATORY_CARE_PROVIDER_SITE_OTHER): Payer: Medicare Other | Admitting: Pharmacist Clinician (PhC)/ Clinical Pharmacy Specialist

## 2014-01-27 DIAGNOSIS — I238 Other current complications following acute myocardial infarction: Secondary | ICD-10-CM

## 2014-01-27 DIAGNOSIS — I6350 Cerebral infarction due to unspecified occlusion or stenosis of unspecified cerebral artery: Secondary | ICD-10-CM

## 2014-01-27 DIAGNOSIS — I639 Cerebral infarction, unspecified: Secondary | ICD-10-CM

## 2014-01-27 DIAGNOSIS — Z5181 Encounter for therapeutic drug level monitoring: Secondary | ICD-10-CM

## 2014-01-27 DIAGNOSIS — Z7901 Long term (current) use of anticoagulants: Secondary | ICD-10-CM

## 2014-01-27 LAB — POCT INR: INR: 2.5

## 2014-02-18 ENCOUNTER — Other Ambulatory Visit: Payer: Self-pay | Admitting: Internal Medicine

## 2014-03-10 ENCOUNTER — Ambulatory Visit (HOSPITAL_COMMUNITY)
Admission: RE | Admit: 2014-03-10 | Discharge: 2014-03-10 | Disposition: A | Discharge status: Home / Self Care | Payer: Medicare Other | Source: Ambulatory Visit | Attending: Internal Medicine | Admitting: Internal Medicine

## 2014-03-10 ENCOUNTER — Telehealth (HOSPITAL_COMMUNITY): Payer: Self-pay | Admitting: Anesthesiology

## 2014-03-10 ENCOUNTER — Ambulatory Visit (INDEPENDENT_AMBULATORY_CARE_PROVIDER_SITE_OTHER): Payer: Medicare Other | Admitting: Pharmacist Clinician (PhC)/ Clinical Pharmacy Specialist

## 2014-03-10 VITALS — BP 126/84 | HR 54 | Resp 18 | Wt 228.0 lb

## 2014-03-10 DIAGNOSIS — I238 Other current complications following acute myocardial infarction: Secondary | ICD-10-CM

## 2014-03-10 DIAGNOSIS — I5022 Chronic systolic (congestive) heart failure: Secondary | ICD-10-CM | POA: Insufficient documentation

## 2014-03-10 DIAGNOSIS — I2510 Atherosclerotic heart disease of native coronary artery without angina pectoris: Secondary | ICD-10-CM | POA: Diagnosis not present

## 2014-03-10 DIAGNOSIS — E785 Hyperlipidemia, unspecified: Secondary | ICD-10-CM | POA: Insufficient documentation

## 2014-03-10 DIAGNOSIS — Z7901 Long term (current) use of anticoagulants: Secondary | ICD-10-CM | POA: Diagnosis not present

## 2014-03-10 DIAGNOSIS — Z8673 Personal history of transient ischemic attack (TIA), and cerebral infarction without residual deficits: Secondary | ICD-10-CM | POA: Insufficient documentation

## 2014-03-10 DIAGNOSIS — Z79899 Other long term (current) drug therapy: Secondary | ICD-10-CM | POA: Diagnosis not present

## 2014-03-10 DIAGNOSIS — E669 Obesity, unspecified: Secondary | ICD-10-CM | POA: Insufficient documentation

## 2014-03-10 DIAGNOSIS — I252 Old myocardial infarction: Secondary | ICD-10-CM | POA: Diagnosis not present

## 2014-03-10 DIAGNOSIS — I639 Cerebral infarction, unspecified: Secondary | ICD-10-CM

## 2014-03-10 DIAGNOSIS — Z95818 Presence of other cardiac implants and grafts: Secondary | ICD-10-CM | POA: Insufficient documentation

## 2014-03-10 DIAGNOSIS — I10 Essential (primary) hypertension: Secondary | ICD-10-CM | POA: Insufficient documentation

## 2014-03-10 DIAGNOSIS — Z5181 Encounter for therapeutic drug level monitoring: Secondary | ICD-10-CM

## 2014-03-10 DIAGNOSIS — Z7982 Long term (current) use of aspirin: Secondary | ICD-10-CM | POA: Insufficient documentation

## 2014-03-10 DIAGNOSIS — E119 Type 2 diabetes mellitus without complications: Secondary | ICD-10-CM | POA: Insufficient documentation

## 2014-03-10 DIAGNOSIS — I6350 Cerebral infarction due to unspecified occlusion or stenosis of unspecified cerebral artery: Secondary | ICD-10-CM

## 2014-03-10 DIAGNOSIS — Z8679 Personal history of other diseases of the circulatory system: Secondary | ICD-10-CM

## 2014-03-10 LAB — POCT INR: INR: 2.1

## 2014-03-10 LAB — POTASSIUM: Potassium: 5 mEq/L (ref 3.7–5.3)

## 2014-03-10 MED ORDER — ENALAPRIL MALEATE 10 MG PO TABS: 10.0000 mg | ORAL_TABLET | Freq: Every day | ORAL | Status: DC

## 2014-03-10 MED ORDER — ENALAPRIL MALEATE 20 MG PO TABS: 10.0000 mg | ORAL_TABLET | Freq: Two times a day (BID) | ORAL | Status: DC

## 2014-03-10 MED ORDER — ENALAPRIL MALEATE 20 MG PO TABS
ORAL_TABLET | ORAL | Status: DC
Start: 2014-03-10 — End: 2014-03-10

## 2014-03-10 NOTE — Patient Instructions (Signed)
Doing wonderful.  Have a great Thanksgiving, Christmas and New Year.  Increase your Enalapril to 10 mg (1/2 tablet) in the morning and 20 mg (1 tablet) in the evening. Call any dizziness.   Go to Dr. Jeannetta NapElkins office in between November 26th-December 3rd to get repeat blood work and have him fax results [phone]  Call if you notice any increase in shortness of breath or not able to do as much as you would like.  Follow up in 1 year with ECHO.  Do the following things EVERYDAY: 1) Weigh yourself in the morning before breakfast. Write it down and keep it in a log. 2) Take your medicines as prescribed 3) Eat low salt foods-Limit salt (sodium) to 2000 mg per day.  4) Stay as active as you can everyday 5) Limit all fluids for the day to less than 2 liters 6)

## 2014-03-10 NOTE — Addendum Note (Signed)
Encounter addended by: Aundria RudAli B Estus Krakowski, NP on: 03/10/2014 11:52 AM<BR>     Documentation filed: Notes Section

## 2014-03-10 NOTE — Telephone Encounter (Signed)
At OV was going to increase enalapril to 10 mg q am and 20 mg q pm, however once lab results resulted K+ 5.0 so will not increase enalapril. Will continue enalapril 10 mg BID and sent new prescription for patient. Called and LM for patient to continue enalapril 10 mg BID and call office back.  Ulla Potashosgrove, Bryla Burek B 1:49 PM

## 2014-03-10 NOTE — Progress Notes (Addendum)
Patient ID: Marcus Ramos, male   DOB: Aug 27, 1940, 73 y.o.   MRN: [id number]  PCP: Dr. Windle GuardWilson Elkins Primary Cardiologist: Dr. Gala RomneyBensimhon  HPI: Marcus Ramos is a 73 year old with diabetes, HTN, CAD and chronic systolic HF. He suffered a large anterolateral MI in 6/10 c/b cardiogenic shock. He was treated with emergent bare-metal stenting of the LAD, as well as a  drug-eluting stent to the circumflex. S/P PTCA RCA. Ejection fraction was 25% with global hypokinesis, and question of a left ventricular thrombus. He was placed on Coumadin as well as aspirin and Plavix. 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  Had echo 8/10 which confirmed EF 25-30%. MRI EF 23%. With full thickness scar. No signifcant viability.  We previously arranged for him to see Dr. Johney FrameAllred to discuss ICD placement. He saw Dr. Johney FrameAllred and was set-up for an ICD but he cancelled his implant appt saying he didn't want a device at this time.  Follow up for Heart Failure: Doing well. Denies SOB, PND, orthopnea or CP. 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 Gets minimal SOB with going up steep inclines. No s/s bleeding. Taking medications as prescribed. Weight at stable at home runs about 230s. Following a low salt diet and drinking less than 2L a day.   Labs: (01/21/14): Cholesterol 113, TG 242, HDL 26, LDL 39, Hgb A1C 6.2, Creatinine 1.04, GFR 72  ROS: All systems negative except as listed in HPI, PMH and Problem List.  Past Medical History  Diagnosis Date  . 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 Essential hypertension, benign   . Type II or unspecified type diabetes mellitus without mention of complication, not stated as uncontrolled   . Obesity, unspecified   . Hypopotassemia   . Gout   . Embolic stroke   . Stented coronary artery     Bare-metal stenting of 100% occluded left descending artery. Staged drug eluting stenting of  circumflex artery    Current Outpatient Prescriptions  Medication Sig Dispense Refill  . allopurinol (ZYLOPRIM) 100 MG tablet TAKE 1 TABLET DAILY 90 tablet 1  . aspirin 81 MG tablet Take 81 mg by mouth daily.      Marland Kitchen. atorvastatin (LIPITOR) 40 MG tablet TAKE 1 TABLET (40 MG TOTAL) DAILY 90 tablet 2  . carvedilol (COREG) 6.25 MG tablet TAKE 1 TABLET TWICE A DAY 180 tablet 1  . colchicine 0.6 MG tablet Take 0.6 mg by mouth as needed.      . enalapril (VASOTEC) 20 MG tablet TAKE ONE-HALF (1/2) TABLET TWICE A DAY 90 tablet 3  . fish oil-omega-3 fatty acids 1000 MG capsule Take 2 g by mouth daily. 2 capsules daily.     . furosemide (LASIX) 20 MG tablet TAKE 1 TABLET EVERY OTHER DAY 90 tablet 2  . nitroGLYCERIN (NITROSTAT) 0.4 MG SL tablet Place 0.4 mg under the tongue every 5 (five) minutes as needed.      Marland Kitchen. spironolactone (ALDACTONE) 25 MG tablet TAKE 1 TABLET DAILY 90 tablet 3  . warfarin (COUMADIN) 5 MG tablet TAKE 1 TABLET AS DIRECTED 150 tablet 1   No current facility-administered medications for this encounter.    Filed Vitals:   03/10/14 0929  BP: 126/84  Pulse: 54  Resp: 18     PHYSICAL EXAM: General:  Well appearing. no resp difficulty HEENT: normal Neck: supple. no JVD. Carotids 2+ bilat; no bruits. No lymphadenopathy or thryomegaly  appreciated. Cor: PMI laterally displaced. Distant. Regular rate & rhythm. No rubs, gallops, murmur. Lungs: clear Abdomen: soft, nontender, nondistended. No hepatosplenomegaly. No bruits or masses. Good bowel sounds. + umbilical hernia Extremities: no cyanosis, clubbing, rash, edema Neuro: alert & orientedx3, cranial nerves grossly intact. moves all 4 extremities w/o difficulty. affect pleasant    ASSESSMENT & PLAN:  1. Chronic Systolic Heart Failure: ICM, EF 30-35% (11/2012) - NYHA I-II symptoms and volume status stable. Continue lasix 20 mg QOD.  - Continue coreg 6.25 mg BID. Will not titrate with low HR.  - Will increase enalapril to 10 mg  qam and 20 mg qpm. Had recent labs with PCP and renal function was stable but no K+ taken. Will check K+ today. If K+ elevated will not increase. Recheck BMET in 7-10 days if we increase.  - Continue Spironolactone 25 mg daily.  - Refused ICD in the past. Will repeat ECHO next visit and if EF remains less than 35% will discuss again with patient about consideration for ICD. Told to call if notices decrease in functional capacity and we can repeat sooner.  - Reinforced the need and importance of daily weights, a low sodium diet, and fluid restriction (less than 2 L a day). Instructed to call the HF clinic if weight increases more than 3 lbs overnight or 5 lbs in a week.  2. Hyperlipidemia - Managed by PCP. Continue Lipitor 40 mg daily.  3. 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 Continue coumadin which [facility].  4. CAD - s/p multiple stents. Denies any s/s of ischemia. Will continue statin, ASA and ACE-I.     F/U 1 year with ECHO Ulla Potashosgrove, Marria Mathison B NP-C 11:20 AM

## 2014-03-15 ENCOUNTER — Other Ambulatory Visit (HOSPITAL_COMMUNITY): Payer: Self-pay | Admitting: Cardiology

## 2014-03-15 MED ORDER — ENALAPRIL MALEATE 10 MG PO TABS: ORAL_TABLET | ORAL | Status: DC

## 2014-04-12 ENCOUNTER — Other Ambulatory Visit (HOSPITAL_COMMUNITY): Payer: Self-pay | Admitting: Internal Medicine

## 2014-04-26 ENCOUNTER — Ambulatory Visit (INDEPENDENT_AMBULATORY_CARE_PROVIDER_SITE_OTHER): Payer: Medicare Other

## 2014-04-26 DIAGNOSIS — Z7901 Long term (current) use of anticoagulants: Secondary | ICD-10-CM

## 2014-04-26 DIAGNOSIS — I639 Cerebral infarction, unspecified: Secondary | ICD-10-CM

## 2014-04-26 DIAGNOSIS — I6350 Cerebral infarction due to unspecified occlusion or stenosis of unspecified cerebral artery: Secondary | ICD-10-CM

## 2014-04-26 DIAGNOSIS — Z5181 Encounter for therapeutic drug level monitoring: Secondary | ICD-10-CM

## 2014-04-26 DIAGNOSIS — I238 Other current complications following acute myocardial infarction: Secondary | ICD-10-CM

## 2014-04-26 LAB — POCT INR: INR: 2.1

## 2014-05-03 ENCOUNTER — Other Ambulatory Visit (HOSPITAL_COMMUNITY): Payer: Self-pay | Admitting: Cardiology

## 2014-05-03 MED ORDER — SPIRONOLACTONE 25 MG PO TABS: 25.0000 mg | ORAL_TABLET | Freq: Every day | ORAL | Status: DC

## 2014-05-04 ENCOUNTER — Other Ambulatory Visit (HOSPITAL_COMMUNITY): Payer: Self-pay | Admitting: Cardiology

## 2014-05-04 MED ORDER — ATORVASTATIN CALCIUM 40 MG PO TABS: ORAL_TABLET | ORAL | Status: DC

## 2014-06-07 ENCOUNTER — Ambulatory Visit (INDEPENDENT_AMBULATORY_CARE_PROVIDER_SITE_OTHER): Payer: Medicare Other | Admitting: *Deleted

## 2014-06-07 DIAGNOSIS — I6350 Cerebral infarction due to unspecified occlusion or stenosis of unspecified cerebral artery: Secondary | ICD-10-CM

## 2014-06-07 DIAGNOSIS — I238 Other current complications following acute myocardial infarction: Secondary | ICD-10-CM

## 2014-06-07 DIAGNOSIS — Z7901 Long term (current) use of anticoagulants: Secondary | ICD-10-CM

## 2014-06-07 DIAGNOSIS — Z5181 Encounter for therapeutic drug level monitoring: Secondary | ICD-10-CM

## 2014-06-07 DIAGNOSIS — I639 Cerebral infarction, unspecified: Secondary | ICD-10-CM

## 2014-06-07 LAB — POCT INR: INR: 2.7

## 2014-06-22 ENCOUNTER — Other Ambulatory Visit (HOSPITAL_COMMUNITY): Payer: Self-pay

## 2014-06-22 MED ORDER — ALLOPURINOL 100 MG PO TABS: 100.0000 mg | ORAL_TABLET | Freq: Every day | ORAL | Status: DC

## 2014-06-23 ENCOUNTER — Other Ambulatory Visit (HOSPITAL_COMMUNITY): Payer: Self-pay | Admitting: *Deleted

## 2014-06-23 DIAGNOSIS — I5022 Chronic systolic (congestive) heart failure: Secondary | ICD-10-CM

## 2014-06-23 MED ORDER — FUROSEMIDE 20 MG PO TABS: 20.0000 mg | ORAL_TABLET | ORAL | Status: DC

## 2014-07-19 ENCOUNTER — Ambulatory Visit (INDEPENDENT_AMBULATORY_CARE_PROVIDER_SITE_OTHER): Payer: Medicare Other

## 2014-07-19 DIAGNOSIS — I238 Other current complications following acute myocardial infarction: Secondary | ICD-10-CM

## 2014-07-19 DIAGNOSIS — Z5181 Encounter for therapeutic drug level monitoring: Secondary | ICD-10-CM | POA: Diagnosis not present

## 2014-07-19 DIAGNOSIS — Z7901 Long term (current) use of anticoagulants: Secondary | ICD-10-CM

## 2014-07-19 DIAGNOSIS — I6350 Cerebral infarction due to unspecified occlusion or stenosis of unspecified cerebral artery: Secondary | ICD-10-CM

## 2014-07-19 DIAGNOSIS — I639 Cerebral infarction, unspecified: Secondary | ICD-10-CM | POA: Diagnosis not present

## 2014-07-19 LAB — POCT INR: INR: 3.6

## 2014-07-25 ENCOUNTER — Other Ambulatory Visit (HOSPITAL_COMMUNITY): Payer: Self-pay | Admitting: Internal Medicine

## 2014-07-25 DIAGNOSIS — I5022 Chronic systolic (congestive) heart failure: Secondary | ICD-10-CM

## 2014-08-08 ENCOUNTER — Other Ambulatory Visit (HOSPITAL_COMMUNITY): Payer: Self-pay | Admitting: Internal Medicine

## 2014-08-09 ENCOUNTER — Ambulatory Visit (INDEPENDENT_AMBULATORY_CARE_PROVIDER_SITE_OTHER): Payer: Medicare Other | Admitting: *Deleted

## 2014-08-09 DIAGNOSIS — I238 Other current complications following acute myocardial infarction: Secondary | ICD-10-CM | POA: Diagnosis not present

## 2014-08-09 DIAGNOSIS — I639 Cerebral infarction, unspecified: Secondary | ICD-10-CM | POA: Diagnosis not present

## 2014-08-09 DIAGNOSIS — Z7901 Long term (current) use of anticoagulants: Secondary | ICD-10-CM

## 2014-08-09 DIAGNOSIS — Z5181 Encounter for therapeutic drug level monitoring: Secondary | ICD-10-CM | POA: Diagnosis not present

## 2014-08-09 DIAGNOSIS — I6350 Cerebral infarction due to unspecified occlusion or stenosis of unspecified cerebral artery: Secondary | ICD-10-CM

## 2014-08-09 LAB — POCT INR: INR: 3.4

## 2014-08-24 ENCOUNTER — Ambulatory Visit (INDEPENDENT_AMBULATORY_CARE_PROVIDER_SITE_OTHER): Payer: Medicare Other | Admitting: *Deleted

## 2014-08-24 DIAGNOSIS — Z7901 Long term (current) use of anticoagulants: Secondary | ICD-10-CM | POA: Diagnosis not present

## 2014-08-24 DIAGNOSIS — I639 Cerebral infarction, unspecified: Secondary | ICD-10-CM

## 2014-08-24 DIAGNOSIS — I238 Other current complications following acute myocardial infarction: Secondary | ICD-10-CM | POA: Diagnosis not present

## 2014-08-24 DIAGNOSIS — Z5181 Encounter for therapeutic drug level monitoring: Secondary | ICD-10-CM | POA: Diagnosis not present

## 2014-08-24 DIAGNOSIS — I6350 Cerebral infarction due to unspecified occlusion or stenosis of unspecified cerebral artery: Secondary | ICD-10-CM

## 2014-08-24 LAB — POCT INR: INR: 2.5

## 2014-09-14 ENCOUNTER — Ambulatory Visit (INDEPENDENT_AMBULATORY_CARE_PROVIDER_SITE_OTHER): Payer: Medicare Other | Admitting: *Deleted

## 2014-09-14 DIAGNOSIS — I639 Cerebral infarction, unspecified: Secondary | ICD-10-CM | POA: Diagnosis not present

## 2014-09-14 DIAGNOSIS — Z5181 Encounter for therapeutic drug level monitoring: Secondary | ICD-10-CM | POA: Diagnosis not present

## 2014-09-14 DIAGNOSIS — Z7901 Long term (current) use of anticoagulants: Secondary | ICD-10-CM | POA: Diagnosis not present

## 2014-09-14 DIAGNOSIS — I238 Other current complications following acute myocardial infarction: Secondary | ICD-10-CM

## 2014-09-14 DIAGNOSIS — I6350 Cerebral infarction due to unspecified occlusion or stenosis of unspecified cerebral artery: Secondary | ICD-10-CM

## 2014-09-14 LAB — POCT INR: INR: 2.2

## 2014-10-12 ENCOUNTER — Ambulatory Visit (INDEPENDENT_AMBULATORY_CARE_PROVIDER_SITE_OTHER): Payer: Medicare Other | Admitting: *Deleted

## 2014-10-12 DIAGNOSIS — I639 Cerebral infarction, unspecified: Secondary | ICD-10-CM | POA: Diagnosis not present

## 2014-10-12 DIAGNOSIS — I238 Other current complications following acute myocardial infarction: Secondary | ICD-10-CM | POA: Diagnosis not present

## 2014-10-12 DIAGNOSIS — Z7901 Long term (current) use of anticoagulants: Secondary | ICD-10-CM

## 2014-10-12 DIAGNOSIS — Z5181 Encounter for therapeutic drug level monitoring: Secondary | ICD-10-CM | POA: Diagnosis not present

## 2014-10-12 DIAGNOSIS — I6350 Cerebral infarction due to unspecified occlusion or stenosis of unspecified cerebral artery: Secondary | ICD-10-CM

## 2014-10-12 LAB — POCT INR: INR: 2.1

## 2014-10-19 ENCOUNTER — Other Ambulatory Visit (HOSPITAL_COMMUNITY): Payer: Self-pay | Admitting: Internal Medicine

## 2014-11-16 ENCOUNTER — Ambulatory Visit (INDEPENDENT_AMBULATORY_CARE_PROVIDER_SITE_OTHER): Payer: Medicare Other | Admitting: *Deleted

## 2014-11-16 DIAGNOSIS — I238 Other current complications following acute myocardial infarction: Secondary | ICD-10-CM

## 2014-11-16 DIAGNOSIS — I639 Cerebral infarction, unspecified: Secondary | ICD-10-CM | POA: Diagnosis not present

## 2014-11-16 DIAGNOSIS — Z5181 Encounter for therapeutic drug level monitoring: Secondary | ICD-10-CM | POA: Diagnosis not present

## 2014-11-16 DIAGNOSIS — Z7901 Long term (current) use of anticoagulants: Secondary | ICD-10-CM

## 2014-11-16 DIAGNOSIS — I6350 Cerebral infarction due to unspecified occlusion or stenosis of unspecified cerebral artery: Secondary | ICD-10-CM

## 2014-11-16 LAB — POCT INR: INR: 3

## 2014-12-28 ENCOUNTER — Ambulatory Visit (INDEPENDENT_AMBULATORY_CARE_PROVIDER_SITE_OTHER): Payer: Medicare Other | Admitting: *Deleted

## 2014-12-28 DIAGNOSIS — Z5181 Encounter for therapeutic drug level monitoring: Secondary | ICD-10-CM

## 2014-12-28 DIAGNOSIS — I238 Other current complications following acute myocardial infarction: Secondary | ICD-10-CM | POA: Diagnosis not present

## 2014-12-28 DIAGNOSIS — I639 Cerebral infarction, unspecified: Secondary | ICD-10-CM

## 2014-12-28 DIAGNOSIS — I6350 Cerebral infarction due to unspecified occlusion or stenosis of unspecified cerebral artery: Secondary | ICD-10-CM

## 2014-12-28 DIAGNOSIS — Z7901 Long term (current) use of anticoagulants: Secondary | ICD-10-CM | POA: Diagnosis not present

## 2014-12-28 LAB — POCT INR: INR: 2.5

## 2015-01-06 ENCOUNTER — Other Ambulatory Visit (HOSPITAL_COMMUNITY): Payer: Self-pay | Admitting: Internal Medicine

## 2015-02-08 ENCOUNTER — Ambulatory Visit (INDEPENDENT_AMBULATORY_CARE_PROVIDER_SITE_OTHER): Payer: Medicare Other | Admitting: *Deleted

## 2015-02-08 DIAGNOSIS — Z5181 Encounter for therapeutic drug level monitoring: Secondary | ICD-10-CM | POA: Diagnosis not present

## 2015-02-08 DIAGNOSIS — I6350 Cerebral infarction due to unspecified occlusion or stenosis of unspecified cerebral artery: Secondary | ICD-10-CM | POA: Diagnosis not present

## 2015-02-08 DIAGNOSIS — I238 Other current complications following acute myocardial infarction: Secondary | ICD-10-CM

## 2015-02-08 DIAGNOSIS — Z7901 Long term (current) use of anticoagulants: Secondary | ICD-10-CM | POA: Diagnosis not present

## 2015-02-08 LAB — POCT INR: INR: 2.5

## 2015-03-22 ENCOUNTER — Ambulatory Visit (INDEPENDENT_AMBULATORY_CARE_PROVIDER_SITE_OTHER): Payer: Medicare Other | Admitting: *Deleted

## 2015-03-22 DIAGNOSIS — I238 Other current complications following acute myocardial infarction: Secondary | ICD-10-CM | POA: Diagnosis not present

## 2015-03-22 DIAGNOSIS — Z7901 Long term (current) use of anticoagulants: Secondary | ICD-10-CM

## 2015-03-22 DIAGNOSIS — I6350 Cerebral infarction due to unspecified occlusion or stenosis of unspecified cerebral artery: Secondary | ICD-10-CM

## 2015-03-22 DIAGNOSIS — Z5181 Encounter for therapeutic drug level monitoring: Secondary | ICD-10-CM | POA: Diagnosis not present

## 2015-03-22 LAB — POCT INR: INR: 1.9

## 2015-04-09 ENCOUNTER — Other Ambulatory Visit: Payer: Self-pay | Admitting: Internal Medicine

## 2015-04-21 ENCOUNTER — Other Ambulatory Visit (HOSPITAL_COMMUNITY): Payer: Self-pay | Admitting: Internal Medicine

## 2015-04-25 ENCOUNTER — Other Ambulatory Visit (HOSPITAL_COMMUNITY): Payer: Self-pay | Admitting: Internal Medicine

## 2015-04-26 ENCOUNTER — Ambulatory Visit (INDEPENDENT_AMBULATORY_CARE_PROVIDER_SITE_OTHER): Payer: Medicare Other | Admitting: *Deleted

## 2015-04-26 DIAGNOSIS — Z5181 Encounter for therapeutic drug level monitoring: Secondary | ICD-10-CM | POA: Diagnosis not present

## 2015-04-26 DIAGNOSIS — Z7901 Long term (current) use of anticoagulants: Secondary | ICD-10-CM

## 2015-04-26 DIAGNOSIS — I238 Other current complications following acute myocardial infarction: Secondary | ICD-10-CM

## 2015-04-26 DIAGNOSIS — I6350 Cerebral infarction due to unspecified occlusion or stenosis of unspecified cerebral artery: Secondary | ICD-10-CM | POA: Diagnosis not present

## 2015-04-26 LAB — POCT INR: INR: 2.1

## 2015-06-07 ENCOUNTER — Ambulatory Visit (INDEPENDENT_AMBULATORY_CARE_PROVIDER_SITE_OTHER): Payer: Medicare Other | Admitting: *Deleted

## 2015-06-07 DIAGNOSIS — I6350 Cerebral infarction due to unspecified occlusion or stenosis of unspecified cerebral artery: Secondary | ICD-10-CM | POA: Diagnosis not present

## 2015-06-07 DIAGNOSIS — I238 Other current complications following acute myocardial infarction: Secondary | ICD-10-CM

## 2015-06-07 DIAGNOSIS — Z7901 Long term (current) use of anticoagulants: Secondary | ICD-10-CM | POA: Diagnosis not present

## 2015-06-07 DIAGNOSIS — Z5181 Encounter for therapeutic drug level monitoring: Secondary | ICD-10-CM | POA: Diagnosis not present

## 2015-06-07 LAB — POCT INR: INR: 2.3

## 2015-06-15 ENCOUNTER — Other Ambulatory Visit: Payer: Self-pay | Admitting: *Deleted

## 2015-06-15 ENCOUNTER — Other Ambulatory Visit (HOSPITAL_COMMUNITY): Payer: Self-pay | Admitting: Internal Medicine

## 2015-06-15 MED ORDER — ALLOPURINOL 100 MG PO TABS: 100.0000 mg | ORAL_TABLET | Freq: Every day | ORAL | Status: DC

## 2015-07-19 ENCOUNTER — Ambulatory Visit (INDEPENDENT_AMBULATORY_CARE_PROVIDER_SITE_OTHER): Payer: Medicare Other | Admitting: *Deleted

## 2015-07-19 DIAGNOSIS — I6350 Cerebral infarction due to unspecified occlusion or stenosis of unspecified cerebral artery: Secondary | ICD-10-CM | POA: Diagnosis not present

## 2015-07-19 DIAGNOSIS — Z5181 Encounter for therapeutic drug level monitoring: Secondary | ICD-10-CM | POA: Diagnosis not present

## 2015-07-19 DIAGNOSIS — Z7901 Long term (current) use of anticoagulants: Secondary | ICD-10-CM

## 2015-07-19 DIAGNOSIS — I238 Other current complications following acute myocardial infarction: Secondary | ICD-10-CM | POA: Diagnosis not present

## 2015-07-19 LAB — POCT INR: INR: 2.5

## 2015-07-21 ENCOUNTER — Other Ambulatory Visit (HOSPITAL_COMMUNITY): Payer: Self-pay | Admitting: Cardiology

## 2015-07-22 ENCOUNTER — Other Ambulatory Visit (HOSPITAL_COMMUNITY): Payer: Self-pay | Admitting: Internal Medicine

## 2015-07-23 ENCOUNTER — Other Ambulatory Visit: Payer: Self-pay | Admitting: Internal Medicine

## 2015-08-27 ENCOUNTER — Other Ambulatory Visit (HOSPITAL_COMMUNITY): Payer: Self-pay | Admitting: Internal Medicine

## 2015-08-30 ENCOUNTER — Ambulatory Visit (INDEPENDENT_AMBULATORY_CARE_PROVIDER_SITE_OTHER): Payer: Medicare Other | Admitting: Pharmacist

## 2015-08-30 DIAGNOSIS — Z7901 Long term (current) use of anticoagulants: Secondary | ICD-10-CM

## 2015-08-30 DIAGNOSIS — I6350 Cerebral infarction due to unspecified occlusion or stenosis of unspecified cerebral artery: Secondary | ICD-10-CM | POA: Diagnosis not present

## 2015-08-30 DIAGNOSIS — I238 Other current complications following acute myocardial infarction: Secondary | ICD-10-CM | POA: Diagnosis not present

## 2015-08-30 DIAGNOSIS — Z5181 Encounter for therapeutic drug level monitoring: Secondary | ICD-10-CM | POA: Diagnosis not present

## 2015-08-30 LAB — POCT INR: INR: 2.7

## 2015-09-15 ENCOUNTER — Other Ambulatory Visit: Payer: Self-pay | Admitting: Internal Medicine

## 2015-10-11 ENCOUNTER — Ambulatory Visit (INDEPENDENT_AMBULATORY_CARE_PROVIDER_SITE_OTHER): Payer: Medicare Other | Admitting: *Deleted

## 2015-10-11 DIAGNOSIS — Z7901 Long term (current) use of anticoagulants: Secondary | ICD-10-CM | POA: Diagnosis not present

## 2015-10-11 DIAGNOSIS — I238 Other current complications following acute myocardial infarction: Secondary | ICD-10-CM

## 2015-10-11 DIAGNOSIS — Z5181 Encounter for therapeutic drug level monitoring: Secondary | ICD-10-CM

## 2015-10-11 DIAGNOSIS — I6350 Cerebral infarction due to unspecified occlusion or stenosis of unspecified cerebral artery: Secondary | ICD-10-CM | POA: Diagnosis not present

## 2015-10-11 LAB — POCT INR: INR: 2.8

## 2015-10-23 ENCOUNTER — Other Ambulatory Visit: Payer: Self-pay | Admitting: Internal Medicine

## 2015-11-21 ENCOUNTER — Ambulatory Visit (INDEPENDENT_AMBULATORY_CARE_PROVIDER_SITE_OTHER): Payer: Medicare Other | Admitting: *Deleted

## 2015-11-21 DIAGNOSIS — I238 Other current complications following acute myocardial infarction: Secondary | ICD-10-CM | POA: Diagnosis not present

## 2015-11-21 DIAGNOSIS — I6350 Cerebral infarction due to unspecified occlusion or stenosis of unspecified cerebral artery: Secondary | ICD-10-CM

## 2015-11-21 DIAGNOSIS — Z7901 Long term (current) use of anticoagulants: Secondary | ICD-10-CM | POA: Diagnosis not present

## 2015-11-21 DIAGNOSIS — Z5181 Encounter for therapeutic drug level monitoring: Secondary | ICD-10-CM | POA: Diagnosis not present

## 2015-11-21 LAB — POCT INR: INR: 2.5

## 2015-12-12 ENCOUNTER — Encounter: Payer: Self-pay | Admitting: Internal Medicine

## 2016-01-02 ENCOUNTER — Encounter (INDEPENDENT_AMBULATORY_CARE_PROVIDER_SITE_OTHER): Payer: Self-pay

## 2016-01-02 ENCOUNTER — Ambulatory Visit (INDEPENDENT_AMBULATORY_CARE_PROVIDER_SITE_OTHER): Payer: Medicare Other | Admitting: *Deleted

## 2016-01-02 DIAGNOSIS — I238 Other current complications following acute myocardial infarction: Secondary | ICD-10-CM | POA: Diagnosis not present

## 2016-01-02 DIAGNOSIS — Z7901 Long term (current) use of anticoagulants: Secondary | ICD-10-CM | POA: Diagnosis not present

## 2016-01-02 DIAGNOSIS — Z5181 Encounter for therapeutic drug level monitoring: Secondary | ICD-10-CM | POA: Diagnosis not present

## 2016-01-02 DIAGNOSIS — I6350 Cerebral infarction due to unspecified occlusion or stenosis of unspecified cerebral artery: Secondary | ICD-10-CM | POA: Diagnosis not present

## 2016-01-02 LAB — POCT INR: INR: 1.9

## 2016-01-03 ENCOUNTER — Other Ambulatory Visit: Payer: Self-pay | Admitting: Internal Medicine

## 2016-01-21 ENCOUNTER — Other Ambulatory Visit: Payer: Self-pay | Admitting: Internal Medicine

## 2016-02-13 ENCOUNTER — Ambulatory Visit (INDEPENDENT_AMBULATORY_CARE_PROVIDER_SITE_OTHER): Payer: Medicare Other | Admitting: *Deleted

## 2016-02-13 DIAGNOSIS — Z7901 Long term (current) use of anticoagulants: Secondary | ICD-10-CM

## 2016-02-13 DIAGNOSIS — I6350 Cerebral infarction due to unspecified occlusion or stenosis of unspecified cerebral artery: Secondary | ICD-10-CM | POA: Diagnosis not present

## 2016-02-13 DIAGNOSIS — I238 Other current complications following acute myocardial infarction: Secondary | ICD-10-CM

## 2016-02-13 DIAGNOSIS — Z5181 Encounter for therapeutic drug level monitoring: Secondary | ICD-10-CM | POA: Diagnosis not present

## 2016-02-13 LAB — POCT INR: INR: 3.2

## 2016-03-05 ENCOUNTER — Ambulatory Visit (INDEPENDENT_AMBULATORY_CARE_PROVIDER_SITE_OTHER): Payer: Medicare Other | Admitting: *Deleted

## 2016-03-05 DIAGNOSIS — Z7901 Long term (current) use of anticoagulants: Secondary | ICD-10-CM | POA: Diagnosis not present

## 2016-03-05 DIAGNOSIS — I238 Other current complications following acute myocardial infarction: Secondary | ICD-10-CM | POA: Diagnosis not present

## 2016-03-05 DIAGNOSIS — I6350 Cerebral infarction due to unspecified occlusion or stenosis of unspecified cerebral artery: Secondary | ICD-10-CM

## 2016-03-05 DIAGNOSIS — Z5181 Encounter for therapeutic drug level monitoring: Secondary | ICD-10-CM

## 2016-03-05 LAB — POCT INR: INR: 1.9

## 2016-03-29 ENCOUNTER — Ambulatory Visit (INDEPENDENT_AMBULATORY_CARE_PROVIDER_SITE_OTHER): Payer: Medicare Other

## 2016-03-29 DIAGNOSIS — Z5181 Encounter for therapeutic drug level monitoring: Secondary | ICD-10-CM | POA: Diagnosis not present

## 2016-03-29 DIAGNOSIS — Z7901 Long term (current) use of anticoagulants: Secondary | ICD-10-CM

## 2016-03-29 DIAGNOSIS — I238 Other current complications following acute myocardial infarction: Secondary | ICD-10-CM | POA: Diagnosis not present

## 2016-03-29 DIAGNOSIS — I6350 Cerebral infarction due to unspecified occlusion or stenosis of unspecified cerebral artery: Secondary | ICD-10-CM | POA: Diagnosis not present

## 2016-03-29 LAB — POCT INR: INR: 2.4

## 2016-04-02 ENCOUNTER — Other Ambulatory Visit: Payer: Self-pay | Admitting: Internal Medicine

## 2016-04-04 ENCOUNTER — Other Ambulatory Visit: Payer: Self-pay | Admitting: Internal Medicine

## 2016-04-20 ENCOUNTER — Other Ambulatory Visit: Payer: Self-pay | Admitting: Cardiology

## 2016-04-26 ENCOUNTER — Ambulatory Visit (INDEPENDENT_AMBULATORY_CARE_PROVIDER_SITE_OTHER): Payer: Medicare Other

## 2016-04-26 DIAGNOSIS — Z5181 Encounter for therapeutic drug level monitoring: Secondary | ICD-10-CM

## 2016-04-26 DIAGNOSIS — Z7901 Long term (current) use of anticoagulants: Secondary | ICD-10-CM | POA: Diagnosis not present

## 2016-04-26 DIAGNOSIS — I238 Other current complications following acute myocardial infarction: Secondary | ICD-10-CM | POA: Diagnosis not present

## 2016-04-26 DIAGNOSIS — I6350 Cerebral infarction due to unspecified occlusion or stenosis of unspecified cerebral artery: Secondary | ICD-10-CM

## 2016-04-26 LAB — POCT INR: INR: 2.6

## 2016-05-15 ENCOUNTER — Other Ambulatory Visit (HOSPITAL_COMMUNITY): Payer: Self-pay | Admitting: *Deleted

## 2016-05-15 ENCOUNTER — Encounter (HOSPITAL_COMMUNITY): Payer: Self-pay | Admitting: Internal Medicine

## 2016-05-15 ENCOUNTER — Ambulatory Visit (HOSPITAL_BASED_OUTPATIENT_CLINIC_OR_DEPARTMENT_OTHER)
Admission: RE | Admit: 2016-05-15 | Discharge: 2016-05-15 | Disposition: A | Discharge status: Home / Self Care | Payer: Medicare Other | Source: Ambulatory Visit | Attending: Internal Medicine | Admitting: Internal Medicine

## 2016-05-15 ENCOUNTER — Ambulatory Visit (HOSPITAL_COMMUNITY)
Admission: RE | Admit: 2016-05-15 | Discharge: 2016-05-15 | Disposition: A | Discharge status: Home / Self Care | Payer: Medicare Other | Source: Ambulatory Visit | Attending: Family Medicine | Admitting: Family Medicine

## 2016-05-15 VITALS — BP 140/80 | HR 60 | Wt 223.5 lb

## 2016-05-15 DIAGNOSIS — I5022 Chronic systolic (congestive) heart failure: Secondary | ICD-10-CM

## 2016-05-15 DIAGNOSIS — I2510 Atherosclerotic heart disease of native coronary artery without angina pectoris: Secondary | ICD-10-CM

## 2016-05-15 DIAGNOSIS — R001 Bradycardia, unspecified: Secondary | ICD-10-CM | POA: Insufficient documentation

## 2016-05-15 LAB — ECHOCARDIOGRAM COMPLETE
CHL CUP MV DEC (S): 246
EERAT: 8.99
EWDT: 246 ms
FS: 19 % — AB (ref 28–44)
IV/PV OW: 1.03
LA diam end sys: 46 mm
LA diam index: 2.02 cm/m2
LA vol A4C: 54.2 ml
LA vol index: 24.6 mL/m2
LA vol: 56 mL
LASIZE: 46 mm
LDCA: 3.46 cm2
LV E/e' medial: 8.99
LV PW d: 10.3 mm — AB (ref 0.6–1.1)
LV TDI E'LATERAL: 5.66
LV e' LATERAL: 5.66 cm/s
LVEEAVG: 8.99
LVOTD: 21 mm
MV pk A vel: 94.1 m/s
MVPKEVEL: 50.9 m/s
RV TAPSE: 16.8 mm
TDI e' medial: 4.46

## 2016-05-15 MED ORDER — PERFLUTREN LIPID MICROSPHERE
1.0000 mL | INTRAVENOUS | Status: AC | PRN
  Administered 2016-05-15: 4 mL via INTRAVENOUS
  Filled 2016-05-15: qty 10

## 2016-05-15 MED ORDER — SACUBITRIL-VALSARTAN 49-51 MG PO TABS: 1.0000 | ORAL_TABLET | Freq: Two times a day (BID) | ORAL | 3 refills | Status: DC

## 2016-05-15 NOTE — Patient Instructions (Signed)
Stop Enalapril.   IN 36 HOURS... START Entresto 49/51 mg twice daily.  Follow up with Dr.Bensimhon in 6 months

## 2016-05-15 NOTE — Addendum Note (Signed)
Encounter addended by: Modesta MessingJasmine Q Dietrich Samuelson, CMA on: 05/15/2016 12:59 PM<BR>    Actions taken: Pharmacy for encounter modified, Order list changed, Sign clinical note

## 2016-05-15 NOTE — Progress Notes (Signed)
24 [facility] x [facility] echo tech request. Patient tolerated well.  Ave FilterBradley, Meeah Totino Genevea, RN

## 2016-05-15 NOTE — Progress Notes (Signed)
  Echocardiogram 2D Echocardiogram has been performed.  Delcie RochENNINGTON, Jim Philemon 05/15/2016, 12:27 PM

## 2016-05-15 NOTE — Progress Notes (Signed)
[facility] NTOE  Patient ID: LJ MIYAMOTO, male   DOB: 31-Mar-1941, 76 y.o.   MRN: [id number]  PCP: Dr. Windle Guard Primary Cardiologist: Dr. Gala Romney  HPI: Frans is a 76 year old with diabetes, HTN, CAD and chronic systolic HF. He suffered a large anterolateral MI in 6/10 c/b cardiogenic shock. He was treated with emergent bare-metal stenting of the LAD, as well as a  drug-eluting stent to the circumflex. S/P PTCA RCA. Ejection fraction was 25% with global hypokinesis, and question of a left ventricular thrombus. He was placed on Coumadin as well as aspirin and Plavix. 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  Had echo 8/10 which confirmed EF 25-30%. MRI EF 23%. With full thickness scar. No signifcant viability.  We previously arranged for him to see Dr. Johney Frame to discuss ICD placement. He saw Dr. Johney Frame and was set-up for an ICD but he cancelled his implant appt saying he didn't want a device at this time.  ECHO 05/15/16 Reviewed personally EF 20-25% apical AK RV ok.   Follow up for Heart Failure: He returns for f/u. We have not seen him in almost 3 years. Says he is very satisfied with things he can do. 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 Can split a little wood but gives out easy and has to slow down. If he really exerts himself gets a little chest pressure but not with regular activities. No swelling. Takes lasix every other day. Taking medications as prescribed. Weight at stable at home runs about 230s. Following a low salt diet and drinking less than 2L a day.   Labs: (01/21/14): Cholesterol 113, TG 242, HDL 26, LDL 39, Hgb A1C 6.2, Creatinine 1.04, GFR 72  ROS: All systems negative except as listed in HPI, PMH and Problem List.  Past Medical History:  Diagnosis Date  . Embolic stroke (HCC)   . Essential hypertension, benign   . Gout   . Hypopotassemia   . Obesity, unspecified   . Other specified forms  of chronic ischemic heart disease   . Stented coronary artery    Bare-metal stenting of 100% occluded left descending artery. Staged drug eluting stenting of circumflex artery  . Type II or unspecified type diabetes mellitus without mention of complication, not stated as uncontrolled     Current Outpatient Prescriptions  Medication Sig Dispense Refill  . allopurinol (ZYLOPRIM) 100 MG tablet TAKE 1 TABLET DAILY (NEED TO CALL TO SCHEDULE AN APPOINTMENT) 90 tablet 3  . aspirin 81 MG tablet Take 81 mg by mouth daily.      Marland Kitchen atorvastatin (LIPITOR) 40 MG tablet TAKE 1 TABLET DAILY 90 tablet 3  . carvedilol (COREG) 6.25 MG tablet TAKE 1 TABLET TWICE A DAY 180 tablet 2  . enalapril (VASOTEC) 10 MG tablet Take 10 mg by mouth 2 (two) times daily.    . fish oil-omega-3 fatty acids 1000 MG capsule Take 2 g by mouth daily. 2 capsules daily.     . furosemide (LASIX) 20 MG tablet TAKE 1 TABLET EVERY OTHER DAY 90 tablet 1  . spironolactone (ALDACTONE) 25 MG tablet TAKE 1 TABLET DAILY 90 tablet 2  . warfarin (COUMADIN) 5 MG tablet TAKE AS DIRECTED BY COUMADIN CLINIC 135 tablet 0  . colchicine 0.6 MG tablet Take 0.6 mg by mouth as needed.      . nitroGLYCERIN (NITROSTAT) 0.4 MG SL tablet Place 0.4 mg under the tongue every 5 (five) minutes  as needed.       No current facility-administered medications for this encounter.    Facility-Administered Medications Ordered in Other Encounters  Medication Dose Route Frequency Provider Last Rate Last Dose  . perflutren lipid microspheres (DEFINITY) IV suspension  1-10 mL Intravenous PRN Dolores Pattyaniel R Ashutosh Dieguez, MD   4 mL at 05/15/16 1204    Vitals:   05/15/16 1222  BP: 140/80  Pulse: 60     PHYSICAL EXAM: General:  Well appearing. no resp difficulty HEENT: normal Neck: supple. no JVD. Carotids 2+ bilat; no bruits. No lymphadenopathy or thryomegaly appreciated. Cor: PMI laterally displaced. Distant. Regular rate & rhythm. No rubs, gallops, murmur. Lungs:  clear Abdomen: soft, nontender, nondistended. No hepatosplenomegaly. No bruits or masses. Good bowel sounds. + umbilical hernia Extremities: no cyanosis, clubbing, rash, edema Neuro: alert & orientedx3, cranial nerves grossly intact. moves all 4 extremities w/o difficulty. affect pleasant   Sinus brady 58 anterolateral Qs  ASSESSMENT & PLAN:  1. Chronic Systolic Heart Failure: ICM, EF 30-35% (11/2012) EF 20-25% today RV ok  - NYHA II symptoms and volume status stable. Continue lasix 20 mg QOD.  - Continue coreg 6.25 mg BID. Will not titrate with low HR.  - Change enalapril to Entresto 49-51 bid may be able to cut back on lasix once on Entresto. Has blood work with Dr. Shelah LewandowskyElkin in a couple in a couple weeks.  - Continue Spironolactone 25 mg daily.  - He has seen EP in past and he doesn't want ICD.  - Reinforced the need and importance of daily weights, a low sodium diet, and fluid restriction (less than 2 L a day). Instructed to call the HF clinic if weight increases more than 3 lbs overnight or 5 lbs in a week.  2. Hyperlipidemia - Managed by PCP. Continue Lipitor 40 mg daily.  3. 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 Continue coumadin which [facility].  4. CAD - s/p multiple stents. Denies any s/s of ischemia. Will continue statin, ASA. Switch enalapril to Entresto.    Arvilla MeresBensimhon, Chirstopher Iovino MD 12:46 PM

## 2016-05-21 ENCOUNTER — Telehealth (HOSPITAL_COMMUNITY): Payer: Self-pay | Admitting: Pharmacist

## 2016-05-21 NOTE — Telephone Encounter (Signed)
Entresto 49-51 mg BID PA approved by [employer] Part D through 05/21/17. ZO#[id number] (phone #[phone])  Tyler DeisErika K. Bonnye FavaNicolsen, PharmD, BCPS, CPP Clinical Pharmacist Pager: [phone] Phone: [phone] 05/21/2016 12:19 PM

## 2016-05-31 ENCOUNTER — Other Ambulatory Visit (HOSPITAL_COMMUNITY): Payer: Self-pay | Admitting: *Deleted

## 2016-05-31 ENCOUNTER — Ambulatory Visit (INDEPENDENT_AMBULATORY_CARE_PROVIDER_SITE_OTHER): Payer: Medicare Other | Admitting: *Deleted

## 2016-05-31 DIAGNOSIS — Z7901 Long term (current) use of anticoagulants: Secondary | ICD-10-CM | POA: Diagnosis not present

## 2016-05-31 DIAGNOSIS — I238 Other current complications following acute myocardial infarction: Secondary | ICD-10-CM | POA: Diagnosis not present

## 2016-05-31 DIAGNOSIS — Z5181 Encounter for therapeutic drug level monitoring: Secondary | ICD-10-CM | POA: Diagnosis not present

## 2016-05-31 DIAGNOSIS — I6350 Cerebral infarction due to unspecified occlusion or stenosis of unspecified cerebral artery: Secondary | ICD-10-CM | POA: Diagnosis not present

## 2016-05-31 LAB — POCT INR: INR: 2.8

## 2016-05-31 MED ORDER — WARFARIN SODIUM 5 MG PO TABS: ORAL_TABLET | ORAL | 0 refills | Status: DC

## 2016-05-31 MED ORDER — SPIRONOLACTONE 25 MG PO TABS: 25.0000 mg | ORAL_TABLET | Freq: Every day | ORAL | 2 refills | Status: DC

## 2016-05-31 MED ORDER — FUROSEMIDE 20 MG PO TABS: 20.0000 mg | ORAL_TABLET | ORAL | 1 refills | Status: DC

## 2016-05-31 MED ORDER — ASPIRIN 81 MG PO TABS: 81.0000 mg | ORAL_TABLET | Freq: Every day | ORAL | 3 refills | Status: DC

## 2016-05-31 MED ORDER — CARVEDILOL 6.25 MG PO TABS: 6.2500 mg | ORAL_TABLET | Freq: Two times a day (BID) | ORAL | 2 refills | Status: DC

## 2016-05-31 MED ORDER — ATORVASTATIN CALCIUM 40 MG PO TABS: 40.0000 mg | ORAL_TABLET | Freq: Every day | ORAL | 3 refills | Status: DC

## 2016-05-31 MED ORDER — ALLOPURINOL 100 MG PO TABS: ORAL_TABLET | ORAL | 3 refills | Status: DC

## 2016-05-31 MED ORDER — SACUBITRIL-VALSARTAN 49-51 MG PO TABS: 1.0000 | ORAL_TABLET | Freq: Two times a day (BID) | ORAL | 3 refills | Status: DC

## 2016-07-12 ENCOUNTER — Ambulatory Visit (INDEPENDENT_AMBULATORY_CARE_PROVIDER_SITE_OTHER): Payer: Medicare Other | Admitting: *Deleted

## 2016-07-12 DIAGNOSIS — I6350 Cerebral infarction due to unspecified occlusion or stenosis of unspecified cerebral artery: Secondary | ICD-10-CM | POA: Diagnosis not present

## 2016-07-12 DIAGNOSIS — I238 Other current complications following acute myocardial infarction: Secondary | ICD-10-CM

## 2016-07-12 DIAGNOSIS — Z7901 Long term (current) use of anticoagulants: Secondary | ICD-10-CM | POA: Diagnosis not present

## 2016-07-12 DIAGNOSIS — Z5181 Encounter for therapeutic drug level monitoring: Secondary | ICD-10-CM

## 2016-07-12 LAB — POCT INR: INR: 3.1

## 2016-08-08 ENCOUNTER — Other Ambulatory Visit (HOSPITAL_COMMUNITY): Payer: Self-pay | Admitting: *Deleted

## 2016-08-08 MED ORDER — SACUBITRIL-VALSARTAN 49-51 MG PO TABS: 1.0000 | ORAL_TABLET | Freq: Two times a day (BID) | ORAL | 11 refills | Status: DC

## 2016-08-09 ENCOUNTER — Other Ambulatory Visit (HOSPITAL_COMMUNITY): Payer: Self-pay

## 2016-08-09 MED ORDER — SACUBITRIL-VALSARTAN 49-51 MG PO TABS: 1.0000 | ORAL_TABLET | Freq: Two times a day (BID) | ORAL | 3 refills | Status: DC

## 2016-08-23 ENCOUNTER — Ambulatory Visit (INDEPENDENT_AMBULATORY_CARE_PROVIDER_SITE_OTHER): Payer: Medicare Other | Admitting: Pharmacist

## 2016-08-23 DIAGNOSIS — I238 Other current complications following acute myocardial infarction: Secondary | ICD-10-CM

## 2016-08-23 DIAGNOSIS — Z5181 Encounter for therapeutic drug level monitoring: Secondary | ICD-10-CM

## 2016-08-23 DIAGNOSIS — Z7901 Long term (current) use of anticoagulants: Secondary | ICD-10-CM | POA: Diagnosis not present

## 2016-08-23 DIAGNOSIS — I6350 Cerebral infarction due to unspecified occlusion or stenosis of unspecified cerebral artery: Secondary | ICD-10-CM

## 2016-08-23 LAB — POCT INR: INR: 2.4

## 2016-09-19 ENCOUNTER — Other Ambulatory Visit (HOSPITAL_COMMUNITY): Payer: Self-pay | Admitting: Internal Medicine

## 2016-10-03 ENCOUNTER — Ambulatory Visit (INDEPENDENT_AMBULATORY_CARE_PROVIDER_SITE_OTHER): Payer: Medicare Other | Admitting: Pharmacist

## 2016-10-03 DIAGNOSIS — Z7901 Long term (current) use of anticoagulants: Secondary | ICD-10-CM | POA: Diagnosis not present

## 2016-10-03 DIAGNOSIS — Z5181 Encounter for therapeutic drug level monitoring: Secondary | ICD-10-CM | POA: Diagnosis not present

## 2016-10-03 DIAGNOSIS — I238 Other current complications following acute myocardial infarction: Secondary | ICD-10-CM | POA: Diagnosis not present

## 2016-10-03 DIAGNOSIS — I6350 Cerebral infarction due to unspecified occlusion or stenosis of unspecified cerebral artery: Secondary | ICD-10-CM | POA: Diagnosis not present

## 2016-10-03 LAB — POCT INR: INR: 4

## 2016-10-31 ENCOUNTER — Ambulatory Visit (INDEPENDENT_AMBULATORY_CARE_PROVIDER_SITE_OTHER): Payer: Medicare Other | Admitting: *Deleted

## 2016-10-31 ENCOUNTER — Encounter (INDEPENDENT_AMBULATORY_CARE_PROVIDER_SITE_OTHER): Payer: Self-pay

## 2016-10-31 DIAGNOSIS — Z7901 Long term (current) use of anticoagulants: Secondary | ICD-10-CM | POA: Diagnosis not present

## 2016-10-31 DIAGNOSIS — I238 Other current complications following acute myocardial infarction: Secondary | ICD-10-CM | POA: Diagnosis not present

## 2016-10-31 DIAGNOSIS — Z5181 Encounter for therapeutic drug level monitoring: Secondary | ICD-10-CM

## 2016-10-31 DIAGNOSIS — I6350 Cerebral infarction due to unspecified occlusion or stenosis of unspecified cerebral artery: Secondary | ICD-10-CM

## 2016-10-31 LAB — POCT INR: INR: 3.7

## 2016-11-18 ENCOUNTER — Ambulatory Visit (INDEPENDENT_AMBULATORY_CARE_PROVIDER_SITE_OTHER): Payer: Medicare Other | Admitting: *Deleted

## 2016-11-18 DIAGNOSIS — I238 Other current complications following acute myocardial infarction: Secondary | ICD-10-CM | POA: Diagnosis not present

## 2016-11-18 DIAGNOSIS — Z7901 Long term (current) use of anticoagulants: Secondary | ICD-10-CM

## 2016-11-18 DIAGNOSIS — I6350 Cerebral infarction due to unspecified occlusion or stenosis of unspecified cerebral artery: Secondary | ICD-10-CM

## 2016-11-18 DIAGNOSIS — Z5181 Encounter for therapeutic drug level monitoring: Secondary | ICD-10-CM | POA: Diagnosis not present

## 2016-11-18 LAB — POCT INR: INR: 3.6

## 2016-12-04 ENCOUNTER — Ambulatory Visit (INDEPENDENT_AMBULATORY_CARE_PROVIDER_SITE_OTHER): Payer: Medicare Other | Admitting: Pharmacist

## 2016-12-04 DIAGNOSIS — Z5181 Encounter for therapeutic drug level monitoring: Secondary | ICD-10-CM

## 2016-12-04 DIAGNOSIS — I238 Other current complications following acute myocardial infarction: Secondary | ICD-10-CM

## 2016-12-04 DIAGNOSIS — I6350 Cerebral infarction due to unspecified occlusion or stenosis of unspecified cerebral artery: Secondary | ICD-10-CM

## 2016-12-04 DIAGNOSIS — Z7901 Long term (current) use of anticoagulants: Secondary | ICD-10-CM

## 2016-12-04 LAB — POCT INR: INR: 2.2

## 2016-12-19 ENCOUNTER — Other Ambulatory Visit (HOSPITAL_COMMUNITY): Payer: Self-pay | Admitting: Internal Medicine

## 2016-12-25 ENCOUNTER — Ambulatory Visit (INDEPENDENT_AMBULATORY_CARE_PROVIDER_SITE_OTHER): Payer: Medicare Other | Admitting: *Deleted

## 2016-12-25 DIAGNOSIS — I6350 Cerebral infarction due to unspecified occlusion or stenosis of unspecified cerebral artery: Secondary | ICD-10-CM

## 2016-12-25 DIAGNOSIS — Z7901 Long term (current) use of anticoagulants: Secondary | ICD-10-CM | POA: Diagnosis not present

## 2016-12-25 DIAGNOSIS — Z5181 Encounter for therapeutic drug level monitoring: Secondary | ICD-10-CM

## 2016-12-25 DIAGNOSIS — I238 Other current complications following acute myocardial infarction: Secondary | ICD-10-CM

## 2016-12-25 LAB — POCT INR: INR: 3.4

## 2017-01-08 ENCOUNTER — Ambulatory Visit (INDEPENDENT_AMBULATORY_CARE_PROVIDER_SITE_OTHER): Payer: Medicare Other | Admitting: *Deleted

## 2017-01-08 DIAGNOSIS — I240 Acute coronary thrombosis not resulting in myocardial infarction: Secondary | ICD-10-CM

## 2017-01-08 DIAGNOSIS — I513 Intracardiac thrombosis, not elsewhere classified: Secondary | ICD-10-CM

## 2017-01-08 DIAGNOSIS — Z5181 Encounter for therapeutic drug level monitoring: Secondary | ICD-10-CM

## 2017-01-08 LAB — POCT INR: INR: 2.6

## 2017-02-05 ENCOUNTER — Ambulatory Visit (INDEPENDENT_AMBULATORY_CARE_PROVIDER_SITE_OTHER): Payer: Medicare Other

## 2017-02-05 DIAGNOSIS — Z5181 Encounter for therapeutic drug level monitoring: Secondary | ICD-10-CM

## 2017-02-05 DIAGNOSIS — I6350 Cerebral infarction due to unspecified occlusion or stenosis of unspecified cerebral artery: Secondary | ICD-10-CM | POA: Diagnosis not present

## 2017-02-05 LAB — POCT INR: INR: 1.7

## 2017-02-26 ENCOUNTER — Ambulatory Visit (INDEPENDENT_AMBULATORY_CARE_PROVIDER_SITE_OTHER): Payer: Medicare Other

## 2017-02-26 DIAGNOSIS — I6350 Cerebral infarction due to unspecified occlusion or stenosis of unspecified cerebral artery: Secondary | ICD-10-CM

## 2017-02-26 DIAGNOSIS — Z5181 Encounter for therapeutic drug level monitoring: Secondary | ICD-10-CM

## 2017-02-26 LAB — POCT INR: INR: 3.3

## 2017-03-19 ENCOUNTER — Ambulatory Visit (INDEPENDENT_AMBULATORY_CARE_PROVIDER_SITE_OTHER): Payer: Medicare Other | Admitting: *Deleted

## 2017-03-19 DIAGNOSIS — I513 Intracardiac thrombosis, not elsewhere classified: Secondary | ICD-10-CM

## 2017-03-19 DIAGNOSIS — I639 Cerebral infarction, unspecified: Secondary | ICD-10-CM

## 2017-03-19 DIAGNOSIS — I240 Acute coronary thrombosis not resulting in myocardial infarction: Secondary | ICD-10-CM

## 2017-03-19 DIAGNOSIS — Z5181 Encounter for therapeutic drug level monitoring: Secondary | ICD-10-CM | POA: Diagnosis not present

## 2017-03-19 LAB — POCT INR: INR: 3.2

## 2017-03-19 NOTE — Patient Instructions (Signed)
Today take 1/2 tablet then start taking 1 tablet daily. Recheck in 3 weeks.

## 2017-04-10 ENCOUNTER — Ambulatory Visit (INDEPENDENT_AMBULATORY_CARE_PROVIDER_SITE_OTHER): Payer: Medicare Other | Admitting: Pharmacist

## 2017-04-10 DIAGNOSIS — Z5181 Encounter for therapeutic drug level monitoring: Secondary | ICD-10-CM | POA: Diagnosis not present

## 2017-04-10 LAB — POCT INR: INR: 3.7

## 2017-04-10 NOTE — Patient Instructions (Signed)
Description   Skip your Coumadin today, then continue taking 1 tablet daily. Stay consistent with your greens. Recheck in 3 weeks.

## 2017-04-13 ENCOUNTER — Other Ambulatory Visit (HOSPITAL_COMMUNITY): Payer: Self-pay | Admitting: Internal Medicine

## 2017-04-15 ENCOUNTER — Other Ambulatory Visit (HOSPITAL_COMMUNITY): Payer: Self-pay | Admitting: Internal Medicine

## 2017-05-01 ENCOUNTER — Ambulatory Visit (INDEPENDENT_AMBULATORY_CARE_PROVIDER_SITE_OTHER): Payer: Medicare Other | Admitting: *Deleted

## 2017-05-01 DIAGNOSIS — I639 Cerebral infarction, unspecified: Secondary | ICD-10-CM

## 2017-05-01 DIAGNOSIS — Z5181 Encounter for therapeutic drug level monitoring: Secondary | ICD-10-CM

## 2017-05-01 LAB — POCT INR: INR: 2.3

## 2017-05-01 NOTE — Patient Instructions (Signed)
Description   Continue taking 1 tablet daily. Stay consistent with your greens. Recheck in 4 weeks. [facility] Clinic [phone]

## 2017-05-15 ENCOUNTER — Ambulatory Visit (HOSPITAL_COMMUNITY)
Admission: RE | Admit: 2017-05-15 | Discharge: 2017-05-15 | Disposition: A | Discharge status: Home / Self Care | Payer: Medicare Other | Source: Ambulatory Visit | Attending: Internal Medicine | Admitting: Internal Medicine

## 2017-05-15 ENCOUNTER — Encounter (HOSPITAL_COMMUNITY): Payer: Self-pay | Admitting: Internal Medicine

## 2017-05-15 ENCOUNTER — Other Ambulatory Visit: Payer: Self-pay

## 2017-05-15 VITALS — BP 142/80 | HR 77 | Wt 221.5 lb

## 2017-05-15 DIAGNOSIS — Z7901 Long term (current) use of anticoagulants: Secondary | ICD-10-CM | POA: Insufficient documentation

## 2017-05-15 DIAGNOSIS — Z8673 Personal history of transient ischemic attack (TIA), and cerebral infarction without residual deficits: Secondary | ICD-10-CM | POA: Diagnosis not present

## 2017-05-15 DIAGNOSIS — Z955 Presence of coronary angioplasty implant and graft: Secondary | ICD-10-CM | POA: Diagnosis not present

## 2017-05-15 DIAGNOSIS — Z79899 Other long term (current) drug therapy: Secondary | ICD-10-CM | POA: Diagnosis not present

## 2017-05-15 DIAGNOSIS — I5022 Chronic systolic (congestive) heart failure: Secondary | ICD-10-CM

## 2017-05-15 DIAGNOSIS — I2510 Atherosclerotic heart disease of native coronary artery without angina pectoris: Secondary | ICD-10-CM | POA: Diagnosis not present

## 2017-05-15 DIAGNOSIS — E785 Hyperlipidemia, unspecified: Secondary | ICD-10-CM | POA: Insufficient documentation

## 2017-05-15 DIAGNOSIS — E119 Type 2 diabetes mellitus without complications: Secondary | ICD-10-CM | POA: Diagnosis not present

## 2017-05-15 DIAGNOSIS — I110 Hypertensive heart disease with heart failure: Secondary | ICD-10-CM | POA: Insufficient documentation

## 2017-05-15 DIAGNOSIS — I252 Old myocardial infarction: Secondary | ICD-10-CM | POA: Diagnosis not present

## 2017-05-15 DIAGNOSIS — M109 Gout, unspecified: Secondary | ICD-10-CM | POA: Diagnosis not present

## 2017-05-15 DIAGNOSIS — E669 Obesity, unspecified: Secondary | ICD-10-CM | POA: Diagnosis not present

## 2017-05-15 DIAGNOSIS — Z7982 Long term (current) use of aspirin: Secondary | ICD-10-CM | POA: Diagnosis not present

## 2017-05-15 LAB — BASIC METABOLIC PANEL
Anion gap: 10 (ref 5–15)
BUN: 17 mg/dL (ref 6–20)
CALCIUM: 9.1 mg/dL (ref 8.9–10.3)
CO2: 23 mmol/L (ref 22–32)
CREATININE: 1.1 mg/dL (ref 0.61–1.24)
Chloride: 104 mmol/L (ref 101–111)
Glucose, Bld: 117 mg/dL — ABNORMAL HIGH (ref 65–99)
Potassium: 4.1 mmol/L (ref 3.5–5.1)
SODIUM: 137 mmol/L (ref 135–145)

## 2017-05-15 LAB — CBC
HCT: 41.6 % (ref 39.0–52.0)
Hemoglobin: 14.2 g/dL (ref 13.0–17.0)
MCH: 31.1 pg (ref 26.0–34.0)
MCHC: 34.1 g/dL (ref 30.0–36.0)
MCV: 91.2 fL (ref 78.0–100.0)
PLATELETS: 221 10*3/uL (ref 150–400)
RBC: 4.56 MIL/uL (ref 4.22–5.81)
RDW: 13.2 % (ref 11.5–15.5)
WBC: 7.5 10*3/uL (ref 4.0–10.5)

## 2017-05-15 MED ORDER — FUROSEMIDE 20 MG PO TABS: 20.0000 mg | ORAL_TABLET | ORAL | 1 refills | Status: DC | PRN

## 2017-05-15 MED ORDER — SACUBITRIL-VALSARTAN 97-103 MG PO TABS: 1.0000 | ORAL_TABLET | Freq: Two times a day (BID) | ORAL | 6 refills | Status: DC

## 2017-05-15 NOTE — Addendum Note (Signed)
Encounter addended by: Noralee SpaceSchub, Shannon Balthazar M, RN on: 05/15/2017 3:00 PM  Actions taken: Diagnosis association updated, Pharmacy for encounter modified, Order list changed, Sign clinical note

## 2017-05-15 NOTE — Progress Notes (Signed)
ADVANCED HF CLINIC NTOE  Patient ID: Marcus FredricksonGeorge N Ramos, male   DOB: [birth year], 77 y.o.   MRN: [id number]  PCP: Dr. Windle GuardWilson Elkins Primary Cardiologist: Dr. Gala RomneyBensimhon  HPI: Marcus StallionGeorge is a 77 year old with diabetes, HTN, CAD and chronic systolic HF. He suffered a large anterolateral MI in 6/10 c/b cardiogenic shock. He was treated with emergent bare-metal stenting of the LAD, as well as a  drug-eluting stent to the circumflex. S/P PTCA RCA. Ejection fraction was 25% with global hypokinesis, and question of a left ventricular thrombus. He was placed on Coumadin as well as aspirin and Plavix. 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  Had echo 8/10 which confirmed EF 25-30%. MRI EF 23%. With full thickness scar. No signifcant viability.  We previously arranged for him to see Dr. Johney FrameAllred to discuss ICD placement. He saw Dr. Johney FrameAllred and was set-up for an ICD but he cancelled his implant appt saying he didn't want a device at this time.  ECHO 05/15/16 Reviewed personally EF 20-25% apical AK RV ok.   Follow up for Heart Failure: He returns for f/u. We haven't seen him for a year. Feels good. Remains active with chores around the house. Says he works a little while and then rests a while. Mild SOB. No change. No CP or edema. No orthopnea or PND.Taking medications as prescribed. Weight around 225. No dizziness. Last LDL with Dr. Jeannetta NapElkins was in 75   Labs: (01/21/14): Cholesterol 113, TG 242, HDL 26, LDL 39, Hgb A1C 6.2, Creatinine 1.04, GFR 72  ROS: All systems negative except as listed in HPI, PMH and Problem List.  Past Medical History:  Diagnosis Date  . Embolic stroke (HCC)   . Essential hypertension, benign   . Gout   . Hypopotassemia   . Obesity, unspecified   . 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 Stented coronary artery    Bare-metal stenting of 100% occluded left descending artery. Staged drug eluting stenting of  circumflex artery  . Type II or unspecified type diabetes mellitus without mention of complication, not stated as uncontrolled     Current Outpatient Medications  Medication Sig Dispense Refill  . allopurinol (ZYLOPRIM) 100 MG tablet TAKE 1 TABLET DAILY (NEED TO CALL TO SCHEDULE AN APPOINTMENT) 90 tablet 3  . aspirin 81 MG tablet Take 1 tablet (81 mg total) by mouth daily. 30 tablet 3  . atorvastatin (LIPITOR) 40 MG tablet Take 1 tablet (40 mg total) by mouth daily. 90 tablet 3  . carvedilol (COREG) 6.25 MG tablet Take 1 tablet (6.25 mg total) by mouth 2 (two) times daily. 180 tablet 2  . colchicine 0.6 MG tablet Take 0.6 mg by mouth as needed.      . fish oil-omega-3 fatty acids 1000 MG capsule Take 2 g by mouth daily. 2 capsules daily.     . furosemide (LASIX) 20 MG tablet Take 1 tablet (20 mg total) by mouth every other day. 90 tablet 1  . nitroGLYCERIN (NITROSTAT) 0.4 MG SL tablet Place 0.4 mg under the tongue every 5 (five) minutes as needed.      . sacubitril-valsartan (ENTRESTO) 49-51 MG Take 1 tablet by mouth 2 (two) times daily. 180 tablet 3  . spironolactone (ALDACTONE) 25 MG tablet TAKE 1 TABLET DAILY 90 tablet 0  . warfarin (COUMADIN) 5 MG tablet TAKE AS DIRECTED BY COUMADIN CLINIC 100 tablet 1   No current facility-administered medications for this  encounter.     Vitals:   05/15/17 1425  BP: (!) 142/80  Pulse: 77  SpO2: 100%     PHYSICAL EXAM: General:  Well appearing. No resp difficulty HEENT: normal Neck: supple. no JVD. Carotids 2+ bilat; no bruits. No lymphadenopathy or thryomegaly appreciated. Cor: PMI laterally displaced. Regular rate & rhythm. No rubs, gallops or murmurs. Lungs: clear Abdomen: obese soft, nontender, nondistended. No hepatosplenomegaly. No bruits or masses. Good bowel sounds. Extremities: no cyanosis, clubbing, rash, edema Neuro: alert & orientedx3, cranial nerves grossly intact. moves all 4 extremities w/o difficulty. Affect pleasant  NSR 80  anterolateral Qs Personally reviewed    ASSESSMENT & PLAN:  1. Chronic Systolic Heart Failure: ICM, EF 30-35% (11/2012) EF 20-25% today RV ok  - NYHA II symptoms and volume status stable. Continue lasix 20 mg QOD.  - Continue coreg 6.25 mg BID.  - Increase Entresto to 97/103. Can take lasix as needed - Continue Spironolactone 25 mg daily.  - He has seen EP in past and he doesn't want ICD.  - Reinforced the need and importance of daily weights, a low sodium diet, and fluid restriction (less than 2 L a day). Instructed to call the HF clinic if weight increases more than 3 lbs overnight or 5 lbs in a week.  2. Hyperlipidemia - Managed by PCP. Continue Lipitor 40 mg daily. Last LDL in 40s.  3. 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 Continue coumadin which is managed by Coumadin Clinic at [facility].  4. CAD - s/p multiple stents.  - Denies s/s ischemia. Will continue statin. With coumadin can stop ASA.   Arvilla Meres MD 2:37 PM

## 2017-05-15 NOTE — Patient Instructions (Addendum)
Increase Entresto to 97/103 mg Twice daily   Stop Aspirin   Decrease Furosemide (Lasix) to AS NEEDED ONLY  We will contact you in 6 months to schedule your next appointment.

## 2017-05-26 ENCOUNTER — Other Ambulatory Visit (HOSPITAL_COMMUNITY): Payer: Self-pay | Admitting: Internal Medicine

## 2017-05-28 ENCOUNTER — Ambulatory Visit: Payer: Medicare Other | Admitting: *Deleted

## 2017-05-28 DIAGNOSIS — Z5181 Encounter for therapeutic drug level monitoring: Secondary | ICD-10-CM | POA: Diagnosis not present

## 2017-05-28 LAB — POCT INR: INR: 2

## 2017-05-28 NOTE — Patient Instructions (Signed)
Description   Continue taking 1 tablet daily.  Recheck in 4 weeks. Coumadin Clinic [phone]     

## 2017-06-18 ENCOUNTER — Telehealth (HOSPITAL_COMMUNITY): Payer: Self-pay | Admitting: Pharmacist

## 2017-06-18 NOTE — Telephone Encounter (Signed)
Entresto PA approved by [employer] through 06/18/18.   Phone #: [phone] ID: [id number]   Tyler DeisErika K. Bonnye FavaNicolsen, PharmD, BCPS, CPP Clinical Pharmacist Phone: [phone] 06/18/2017 11:27 AM

## 2017-06-19 ENCOUNTER — Other Ambulatory Visit (HOSPITAL_COMMUNITY): Payer: Self-pay | Admitting: Internal Medicine

## 2017-06-23 ENCOUNTER — Other Ambulatory Visit: Payer: Self-pay | Admitting: Cardiology

## 2017-06-25 ENCOUNTER — Ambulatory Visit: Payer: Medicare Other | Admitting: *Deleted

## 2017-06-25 DIAGNOSIS — Z5181 Encounter for therapeutic drug level monitoring: Secondary | ICD-10-CM | POA: Diagnosis not present

## 2017-06-25 LAB — POCT INR: INR: 1.9

## 2017-06-25 NOTE — Patient Instructions (Signed)
Description   Today March 6th take 1 and 1/2 tablets (7.5mg ) then continue taking 1 tablet daily. Recheck in 4 weeks. Coumadin Clinic [phone]

## 2017-07-04 ENCOUNTER — Telehealth (HOSPITAL_COMMUNITY): Payer: Self-pay | Admitting: *Deleted

## 2017-07-04 NOTE — Telephone Encounter (Signed)
Received clearance forms from [facility] needing clearnace for pt to have cataract extraction on both eyes w/topical anesthesia w/IV medication.  Per Dr Gala RomneyBensimhon: "patient is at low cardiac risk for surgery/cataract extration, is medically stable to proceed with procedure"  Note faxed back to them at [phone]

## 2017-07-23 ENCOUNTER — Ambulatory Visit: Payer: Medicare Other | Admitting: *Deleted

## 2017-07-23 DIAGNOSIS — Z5181 Encounter for therapeutic drug level monitoring: Secondary | ICD-10-CM

## 2017-07-23 LAB — POCT INR: INR: 2.7

## 2017-07-23 NOTE — Patient Instructions (Signed)
Description   Continue taking 1 tablet daily.  Recheck in 4 weeks. Coumadin Clinic [phone]     

## 2017-07-27 ENCOUNTER — Other Ambulatory Visit (HOSPITAL_COMMUNITY): Payer: Self-pay | Admitting: Internal Medicine

## 2017-08-17 ENCOUNTER — Other Ambulatory Visit (HOSPITAL_COMMUNITY): Payer: Self-pay | Admitting: Internal Medicine

## 2017-08-20 ENCOUNTER — Ambulatory Visit: Payer: Medicare Other | Admitting: *Deleted

## 2017-08-20 DIAGNOSIS — Z5181 Encounter for therapeutic drug level monitoring: Secondary | ICD-10-CM | POA: Diagnosis not present

## 2017-08-20 LAB — POCT INR: INR: 2

## 2017-08-20 NOTE — Patient Instructions (Signed)
Description   Today take 1.5 tablets, then Continue taking 1 tablet daily. Recheck in 4 weeks. Coumadin Clinic [phone]

## 2017-09-17 ENCOUNTER — Ambulatory Visit: Payer: Medicare Other | Admitting: *Deleted

## 2017-09-17 DIAGNOSIS — Z5181 Encounter for therapeutic drug level monitoring: Secondary | ICD-10-CM

## 2017-09-17 LAB — POCT INR: INR: 2.7 (ref 2.0–3.0)

## 2017-09-17 NOTE — Patient Instructions (Signed)
Description   Continue taking 1 tablet daily. Recheck in 5 weeks. Coumadin Clinic [phone]

## 2017-10-22 ENCOUNTER — Ambulatory Visit (INDEPENDENT_AMBULATORY_CARE_PROVIDER_SITE_OTHER): Payer: Medicare Other | Admitting: *Deleted

## 2017-10-22 DIAGNOSIS — Z5181 Encounter for therapeutic drug level monitoring: Secondary | ICD-10-CM | POA: Diagnosis not present

## 2017-10-22 LAB — POCT INR: INR: 2.6 (ref 2.0–3.0)

## 2017-10-22 NOTE — Patient Instructions (Signed)
Description   Continue taking 1 tablet daily. Recheck in 6 weeks. Coumadin Clinic [phone]     

## 2017-11-10 ENCOUNTER — Other Ambulatory Visit: Payer: Self-pay | Admitting: Family Medicine

## 2017-11-10 DIAGNOSIS — N5089 Other specified disorders of the male genital organs: Secondary | ICD-10-CM

## 2017-11-15 ENCOUNTER — Other Ambulatory Visit (HOSPITAL_COMMUNITY): Payer: Self-pay | Admitting: Internal Medicine

## 2017-11-16 NOTE — Progress Notes (Signed)
Advanced Heart Failure Clinic Note   Patient ID: BREKKEN BEACH, male   DOB: Feb 25, 1941, 77 y.o.   MRN: [id number]  PCP: Dr. Windle Guard Primary Cardiologist: Dr. Gala Romney  HPI: Gemini is a 77 year old with diabetes, HTN, CAD and chronic systolic HF. He suffered a large anterolateral MI in 6/10 c/b cardiogenic shock. He was treated with emergent bare-metal stenting of the LAD, as well as a  drug-eluting stent to the circumflex. S/P PTCA RCA. Ejection fraction was 25% with global hypokinesis, and question of a left ventricular thrombus. He was placed on Coumadin as well as aspirin and Plavix. 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  Had echo 8/10 which confirmed EF 25-30%. MRI EF 23%. With full thickness scar. No signifcant viability.  We previously arranged for him to see Dr. Johney Frame to discuss ICD placement. He saw Dr. Johney Frame and was set-up for an ICD but he cancelled his implant appt saying he didn't want a device at that time.  ECHO 05/15/16 EF 20-25% apical AK RV ok.   He returns for f/u. Says overall he is doing well but when it gets hot it is harded for him to breathe and do his activities. No CP. No edema, orthopnea or PND. No palpitations.   Labs: (01/21/14): Cholesterol 113, TG 242, HDL 26, LDL 39, Hgb A1C 6.2, Creatinine 1.04, GFR 72  Review of systems complete and found to be negative unless listed in HPI.    Past Medical History:  Diagnosis Date  . Embolic stroke (HCC)   . Essential hypertension, benign   . Gout   . Hypopotassemia   . Obesity, unspecified   . 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 Stented coronary artery    Bare-metal stenting of 100% occluded left descending artery. Staged drug eluting stenting of circumflex artery  . Type II or unspecified type diabetes mellitus without mention of complication, not stated as uncontrolled     Current Outpatient Medications  Medication  Sig Dispense Refill  . allopurinol (ZYLOPRIM) 100 MG tablet TAKE 1 TABLET DAILY (NEED TO CALL TO SCHEDULE AN APPOINTMENT) 90 tablet 3  . atorvastatin (LIPITOR) 40 MG tablet TAKE 1 TABLET DAILY 90 tablet 3  . carvedilol (COREG) 6.25 MG tablet TAKE 1 TABLET TWICE A DAY 180 tablet 2  . colchicine 0.6 MG tablet Take 0.6 mg by mouth as needed.      . fish oil-omega-3 fatty acids 1000 MG capsule Take 2 g by mouth daily. 2 capsules daily.     . nitroGLYCERIN (NITROSTAT) 0.4 MG SL tablet Place 0.4 mg under the tongue every 5 (five) minutes as needed.      . sacubitril-valsartan (ENTRESTO) 97-103 MG Take 1 tablet by mouth 2 (two) times daily. 60 tablet 6  . spironolactone (ALDACTONE) 25 MG tablet TAKE 1 TABLET DAILY 90 tablet 0  . warfarin (COUMADIN) 5 MG tablet TAKE AS DIRECTED BY COUMADIN CLINIC 100 tablet 1   No current facility-administered medications for this encounter.     Vitals:   11/17/17 1149  BP: (!) 111/58  Pulse: 78  SpO2: 98%  Weight: 224 lb (101.6 kg)    Wt Readings from Last 3 Encounters:  11/17/17 224 lb (101.6 kg)  05/15/17 221 lb 8 oz (100.5 kg)  05/15/16 223 lb 8 oz (101.4 kg)    PHYSICAL EXAM: General:  Well appearing. No resp difficulty HEENT: normal Neck: supple. no JVD. Carotids 2+ bilat;  no bruits. No lymphadenopathy or thryomegaly appreciated. Cor: PMI lateral displaced. Regular rate & rhythm. No rubs, gallops or murmurs. Lungs: clear Abdomen: soft, nontender, nondistended. No hepatosplenomegaly. No bruits or masses. Good bowel sounds. Extremities: no cyanosis, clubbing, rash, edema Neuro: alert & orientedx3, cranial nerves grossly intact. moves all 4 extremities w/o difficulty. Affect pleasant  ECG: NSR 78 with anteroseptal Qs occasional PVCs. Personally reviewed   ASSESSMENT & PLAN:  1. Chronic Systolic Heart Failure: ICM, EF 30-35% (11/2012) EF (1/18)  - Echo 1/18 20-25% RV ok  - Stable NYHA II-III - Volume status looks good - Continue lasix as needed.   - Increase coreg to 9.375 mg BID.  - Continue Entresto 97/103. Can take lasix as needed - Continue Spironolactone 25 mg daily.  - He has seen EP in past and he doesn't want ICD.  - Reinforced fluid restriction to < 2 L daily, sodium restriction to less than 2000 mg daily, and the importance of daily weights.   2. Hyperlipidemia - Per PCP. Continue Lipitor 40 mg daily. 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 Continue coumadin which [facility]. No change.  4. CAD - No s/s of ischemia   - s/p multiple stents.  - Continue statin. LDL 50 Followed by Dr. Shelah LewandowskyElkin. -Off ASA with coumadin.  Arvilla Meresaniel Ashlen Kiger, MD  12:33 PM

## 2017-11-17 ENCOUNTER — Other Ambulatory Visit: Payer: Self-pay

## 2017-11-17 ENCOUNTER — Ambulatory Visit (HOSPITAL_COMMUNITY)
Admission: RE | Admit: 2017-11-17 | Discharge: 2017-11-17 | Disposition: A | Discharge status: Home / Self Care | Payer: Medicare Other | Source: Ambulatory Visit | Attending: Internal Medicine | Admitting: Internal Medicine

## 2017-11-17 VITALS — BP 111/58 | HR 78 | Wt 224.0 lb

## 2017-11-17 DIAGNOSIS — I2510 Atherosclerotic heart disease of native coronary artery without angina pectoris: Secondary | ICD-10-CM | POA: Insufficient documentation

## 2017-11-17 DIAGNOSIS — Z79899 Other long term (current) drug therapy: Secondary | ICD-10-CM | POA: Insufficient documentation

## 2017-11-17 DIAGNOSIS — I5022 Chronic systolic (congestive) heart failure: Secondary | ICD-10-CM | POA: Insufficient documentation

## 2017-11-17 DIAGNOSIS — E785 Hyperlipidemia, unspecified: Secondary | ICD-10-CM | POA: Insufficient documentation

## 2017-11-17 DIAGNOSIS — Z8673 Personal history of transient ischemic attack (TIA), and cerebral infarction without residual deficits: Secondary | ICD-10-CM | POA: Insufficient documentation

## 2017-11-17 DIAGNOSIS — E119 Type 2 diabetes mellitus without complications: Secondary | ICD-10-CM | POA: Diagnosis not present

## 2017-11-17 DIAGNOSIS — I110 Hypertensive heart disease with heart failure: Secondary | ICD-10-CM | POA: Insufficient documentation

## 2017-11-17 MED ORDER — CARVEDILOL 6.25 MG PO TABS: 9.3750 mg | ORAL_TABLET | Freq: Two times a day (BID) | ORAL | 3 refills | Status: DC

## 2017-11-17 NOTE — Addendum Note (Signed)
Encounter addended by: Georgina PeerFarver, Lecil Tapp S, RN on: 11/17/2017 12:43 PM  Actions taken: Pharmacy for encounter modified, Order list changed, Sign clinical note

## 2017-11-17 NOTE — Addendum Note (Signed)
Encounter addended by: Georgina PeerFarver, Leylany Nored S, RN on: 11/17/2017 2:00 PM  Actions taken: Order list changed, Diagnosis association updated

## 2017-11-17 NOTE — Patient Instructions (Signed)
INCREASE Carvedilol to 9.375 mg (1.5 Tablets) Twice Daily.   Call [phone], Option 5 if you start to feel dizzy/lightheaded.  Echocardiogram and follow up in 12 months, we'll call you to schedule appointment.

## 2017-11-19 ENCOUNTER — Other Ambulatory Visit (HOSPITAL_COMMUNITY): Payer: Self-pay

## 2017-11-19 MED ORDER — WARFARIN SODIUM 5 MG PO TABS: ORAL_TABLET | ORAL | 1 refills | Status: DC

## 2017-11-25 ENCOUNTER — Ambulatory Visit
Admission: RE | Admit: 2017-11-25 | Discharge: 2017-11-25 | Disposition: A | Discharge status: Home / Self Care | Payer: Medicare Other | Source: Ambulatory Visit | Attending: Family Medicine | Admitting: Family Medicine

## 2017-11-25 DIAGNOSIS — N5089 Other specified disorders of the male genital organs: Secondary | ICD-10-CM

## 2017-12-03 ENCOUNTER — Ambulatory Visit (INDEPENDENT_AMBULATORY_CARE_PROVIDER_SITE_OTHER): Payer: Medicare Other | Admitting: *Deleted

## 2017-12-03 DIAGNOSIS — Z5181 Encounter for therapeutic drug level monitoring: Secondary | ICD-10-CM | POA: Diagnosis not present

## 2017-12-03 LAB — POCT INR: INR: 2.5 (ref 2.0–3.0)

## 2017-12-03 NOTE — Patient Instructions (Signed)
Description   Continue taking 1 tablet daily. Recheck in 6 weeks. Coumadin Clinic [phone]     

## 2017-12-19 ENCOUNTER — Other Ambulatory Visit (HOSPITAL_COMMUNITY): Payer: Self-pay | Admitting: Internal Medicine

## 2018-01-14 ENCOUNTER — Ambulatory Visit: Payer: Medicare Other | Admitting: *Deleted

## 2018-01-14 DIAGNOSIS — Z5181 Encounter for therapeutic drug level monitoring: Secondary | ICD-10-CM | POA: Diagnosis not present

## 2018-01-14 LAB — POCT INR: INR: 2.3 (ref 2.0–3.0)

## 2018-01-14 NOTE — Patient Instructions (Signed)
Description   Continue taking 1 tablet daily. Recheck in 6 weeks. Coumadin Clinic [phone]     

## 2018-01-15 ENCOUNTER — Other Ambulatory Visit (HOSPITAL_COMMUNITY): Payer: Self-pay | Admitting: Internal Medicine

## 2018-02-25 ENCOUNTER — Ambulatory Visit: Payer: Medicare Other | Admitting: *Deleted

## 2018-02-25 DIAGNOSIS — Z5181 Encounter for therapeutic drug level monitoring: Secondary | ICD-10-CM

## 2018-02-25 LAB — POCT INR: INR: 2.2 (ref 2.0–3.0)

## 2018-02-25 NOTE — Patient Instructions (Signed)
Description   Continue taking 1 tablet daily. Recheck in 6 weeks. Coumadin Clinic [phone]     

## 2018-04-09 ENCOUNTER — Ambulatory Visit: Payer: Medicare Other | Admitting: Pharmacist

## 2018-04-09 DIAGNOSIS — Z5181 Encounter for therapeutic drug level monitoring: Secondary | ICD-10-CM | POA: Diagnosis not present

## 2018-04-09 LAB — POCT INR: INR: 2.3 (ref 2.0–3.0)

## 2018-04-09 NOTE — Patient Instructions (Signed)
Continue taking 1 tablet daily. Recheck in 6 weeks. Coumadin Clinic [phone] 

## 2018-05-18 ENCOUNTER — Other Ambulatory Visit (HOSPITAL_COMMUNITY): Payer: Self-pay | Admitting: Internal Medicine

## 2018-05-21 ENCOUNTER — Encounter (INDEPENDENT_AMBULATORY_CARE_PROVIDER_SITE_OTHER): Payer: Self-pay

## 2018-05-21 ENCOUNTER — Ambulatory Visit (INDEPENDENT_AMBULATORY_CARE_PROVIDER_SITE_OTHER): Payer: Medicare Other | Admitting: *Deleted

## 2018-05-21 DIAGNOSIS — Z5181 Encounter for therapeutic drug level monitoring: Secondary | ICD-10-CM

## 2018-05-21 LAB — POCT INR: INR: 2.7 (ref 2.0–3.0)

## 2018-05-21 NOTE — Patient Instructions (Signed)
Description   Continue taking 1 tablet daily. Recheck in 6 weeks. Coumadin Clinic [phone]

## 2018-05-22 ENCOUNTER — Other Ambulatory Visit (HOSPITAL_COMMUNITY): Payer: Self-pay | Admitting: Internal Medicine

## 2018-07-02 ENCOUNTER — Other Ambulatory Visit: Payer: Self-pay

## 2018-07-02 ENCOUNTER — Ambulatory Visit (INDEPENDENT_AMBULATORY_CARE_PROVIDER_SITE_OTHER): Payer: Medicare Other | Admitting: Pharmacist

## 2018-07-02 DIAGNOSIS — Z5181 Encounter for therapeutic drug level monitoring: Secondary | ICD-10-CM | POA: Diagnosis not present

## 2018-07-02 LAB — POCT INR: INR: 2.2 (ref 2.0–3.0)

## 2018-07-02 NOTE — Patient Instructions (Signed)
Continue taking 1 tablet daily. Recheck in 6 weeks. Coumadin Clinic [phone]

## 2018-08-11 ENCOUNTER — Telehealth: Payer: Self-pay

## 2018-08-11 NOTE — Telephone Encounter (Signed)

## 2018-08-13 ENCOUNTER — Ambulatory Visit (INDEPENDENT_AMBULATORY_CARE_PROVIDER_SITE_OTHER): Payer: Medicare Other | Admitting: Pharmacist

## 2018-08-13 ENCOUNTER — Other Ambulatory Visit: Payer: Self-pay

## 2018-08-13 DIAGNOSIS — Z5181 Encounter for therapeutic drug level monitoring: Secondary | ICD-10-CM | POA: Diagnosis not present

## 2018-08-13 LAB — POCT INR: INR: 2.6 (ref 2.0–3.0)

## 2018-08-13 NOTE — Patient Instructions (Signed)
Description   Spoke with pt and instructed pt to continue taking 1 tablet daily. Recheck in 8 weeks. Coumadin Clinic [phone]

## 2018-10-01 ENCOUNTER — Telehealth: Payer: Self-pay

## 2018-10-01 NOTE — Telephone Encounter (Signed)
lmom for prescreen  

## 2018-10-07 ENCOUNTER — Telehealth: Payer: Self-pay | Admitting: *Deleted

## 2018-10-07 NOTE — Telephone Encounter (Signed)

## 2018-10-08 ENCOUNTER — Ambulatory Visit (INDEPENDENT_AMBULATORY_CARE_PROVIDER_SITE_OTHER): Payer: Medicare Other | Admitting: *Deleted

## 2018-10-08 ENCOUNTER — Other Ambulatory Visit: Payer: Self-pay

## 2018-10-08 DIAGNOSIS — Z5181 Encounter for therapeutic drug level monitoring: Secondary | ICD-10-CM | POA: Diagnosis not present

## 2018-10-08 LAB — POCT INR: INR: 3 (ref 2.0–3.0)

## 2018-10-08 NOTE — Patient Instructions (Signed)
Description   Continue taking 1 tablet daily. Recheck in 8 weeks. Coumadin Clinic [phone]

## 2018-12-03 ENCOUNTER — Ambulatory Visit (INDEPENDENT_AMBULATORY_CARE_PROVIDER_SITE_OTHER): Payer: Medicare Other | Admitting: *Deleted

## 2018-12-03 ENCOUNTER — Encounter (INDEPENDENT_AMBULATORY_CARE_PROVIDER_SITE_OTHER): Payer: Self-pay

## 2018-12-03 ENCOUNTER — Other Ambulatory Visit: Payer: Self-pay

## 2018-12-03 DIAGNOSIS — Z7901 Long term (current) use of anticoagulants: Secondary | ICD-10-CM

## 2018-12-03 DIAGNOSIS — Z5181 Encounter for therapeutic drug level monitoring: Secondary | ICD-10-CM | POA: Diagnosis not present

## 2018-12-03 LAB — POCT INR: INR: 3 (ref 2.0–3.0)

## 2018-12-03 NOTE — Patient Instructions (Signed)
Description   Continue taking 1 tablet daily. Recheck in 8 weeks. Coumadin Clinic [phone]    

## 2018-12-10 ENCOUNTER — Other Ambulatory Visit (HOSPITAL_COMMUNITY): Payer: Self-pay | Admitting: Internal Medicine

## 2018-12-17 ENCOUNTER — Other Ambulatory Visit (HOSPITAL_COMMUNITY): Payer: Self-pay | Admitting: Internal Medicine

## 2018-12-24 ENCOUNTER — Other Ambulatory Visit: Payer: Self-pay

## 2018-12-24 ENCOUNTER — Encounter (HOSPITAL_COMMUNITY): Payer: Self-pay | Admitting: Internal Medicine

## 2018-12-24 ENCOUNTER — Ambulatory Visit (HOSPITAL_COMMUNITY)
Admission: RE | Admit: 2018-12-24 | Discharge: 2018-12-24 | Disposition: A | Discharge status: Home / Self Care | Payer: Medicare Other | Source: Ambulatory Visit | Attending: Internal Medicine | Admitting: Internal Medicine

## 2018-12-24 VITALS — BP 110/64 | HR 64 | Wt 216.6 lb

## 2018-12-24 DIAGNOSIS — I252 Old myocardial infarction: Secondary | ICD-10-CM | POA: Insufficient documentation

## 2018-12-24 DIAGNOSIS — Z7902 Long term (current) use of antithrombotics/antiplatelets: Secondary | ICD-10-CM | POA: Insufficient documentation

## 2018-12-24 DIAGNOSIS — R9431 Abnormal electrocardiogram [ECG] [EKG]: Secondary | ICD-10-CM | POA: Diagnosis not present

## 2018-12-24 DIAGNOSIS — Z8673 Personal history of transient ischemic attack (TIA), and cerebral infarction without residual deficits: Secondary | ICD-10-CM | POA: Insufficient documentation

## 2018-12-24 DIAGNOSIS — M109 Gout, unspecified: Secondary | ICD-10-CM | POA: Diagnosis not present

## 2018-12-24 DIAGNOSIS — E119 Type 2 diabetes mellitus without complications: Secondary | ICD-10-CM | POA: Insufficient documentation

## 2018-12-24 DIAGNOSIS — I2510 Atherosclerotic heart disease of native coronary artery without angina pectoris: Secondary | ICD-10-CM | POA: Insufficient documentation

## 2018-12-24 DIAGNOSIS — Z79899 Other long term (current) drug therapy: Secondary | ICD-10-CM | POA: Insufficient documentation

## 2018-12-24 DIAGNOSIS — Z7901 Long term (current) use of anticoagulants: Secondary | ICD-10-CM | POA: Diagnosis not present

## 2018-12-24 DIAGNOSIS — E785 Hyperlipidemia, unspecified: Secondary | ICD-10-CM | POA: Diagnosis not present

## 2018-12-24 DIAGNOSIS — Z7982 Long term (current) use of aspirin: Secondary | ICD-10-CM | POA: Diagnosis not present

## 2018-12-24 DIAGNOSIS — E669 Obesity, unspecified: Secondary | ICD-10-CM | POA: Insufficient documentation

## 2018-12-24 DIAGNOSIS — I5022 Chronic systolic (congestive) heart failure: Secondary | ICD-10-CM | POA: Diagnosis not present

## 2018-12-24 DIAGNOSIS — I240 Acute coronary thrombosis not resulting in myocardial infarction: Secondary | ICD-10-CM

## 2018-12-24 DIAGNOSIS — I513 Intracardiac thrombosis, not elsewhere classified: Secondary | ICD-10-CM

## 2018-12-24 DIAGNOSIS — I110 Hypertensive heart disease with heart failure: Secondary | ICD-10-CM | POA: Diagnosis not present

## 2018-12-24 DIAGNOSIS — Z955 Presence of coronary angioplasty implant and graft: Secondary | ICD-10-CM | POA: Diagnosis not present

## 2018-12-24 DIAGNOSIS — L905 Scar conditions and fibrosis of skin: Secondary | ICD-10-CM | POA: Diagnosis not present

## 2018-12-24 LAB — COMPREHENSIVE METABOLIC PANEL
ALT: 20 U/L (ref 0–44)
AST: 21 U/L (ref 15–41)
Albumin: 3.8 g/dL (ref 3.5–5.0)
Alkaline Phosphatase: 35 U/L — ABNORMAL LOW (ref 38–126)
Anion gap: 10 (ref 5–15)
BUN: 17 mg/dL (ref 8–23)
CO2: 22 mmol/L (ref 22–32)
Calcium: 8.5 mg/dL — ABNORMAL LOW (ref 8.9–10.3)
Chloride: 105 mmol/L (ref 98–111)
Creatinine, Ser: 1.21 mg/dL (ref 0.61–1.24)
GFR calc Af Amer: >60 mL/min (ref >60)
GFR calc non Af Amer: 58 mL/min — ABNORMAL LOW (ref >60)
Glucose, Bld: 146 mg/dL — ABNORMAL HIGH (ref 70–99)
Potassium: 4.2 mmol/L (ref 3.5–5.1)
Sodium: 137 mmol/L (ref 135–145)
Total Bilirubin: 0.9 mg/dL (ref 0.3–1.2)
Total Protein: 6.3 g/dL — ABNORMAL LOW (ref 6.5–8.1)

## 2018-12-24 LAB — CBC
HCT: 43.4 % (ref 39.0–52.0)
Hemoglobin: 14.3 g/dL (ref 13.0–17.0)
MCH: 30.9 pg (ref 26.0–34.0)
MCHC: 32.9 g/dL (ref 30.0–36.0)
MCV: 93.7 fL (ref 80.0–100.0)
Platelets: 220 10*3/uL (ref 150–400)
RBC: 4.63 MIL/uL (ref 4.22–5.81)
RDW: 12.9 % (ref 11.5–15.5)
WBC: 6.6 10*3/uL (ref 4.0–10.5)
nRBC: 0 % (ref 0.0–0.2)

## 2018-12-24 LAB — CK: Total CK: 229 U/L (ref 49–397)

## 2018-12-24 LAB — BRAIN NATRIURETIC PEPTIDE: B Natriuretic Peptide: 66.2 pg/mL (ref 0.0–100.0)

## 2018-12-24 MED ORDER — ENTRESTO 97-103 MG PO TABS: 1.0000 | ORAL_TABLET | Freq: Two times a day (BID) | ORAL | 11 refills | Status: DC

## 2018-12-24 MED ORDER — EMPAGLIFLOZIN 10 MG PO TABS: 10.0000 mg | ORAL_TABLET | Freq: Every day | ORAL | 11 refills | Status: DC

## 2018-12-24 NOTE — Progress Notes (Signed)
Advanced Heart Failure Clinic Note   Patient ID: Marcus Ramos, male   DOB: 1940-06-13, 78 y.o.   MRN: [id number]  PCP: Dr. Claris Ramos Primary Cardiologist: Dr. Haroldine Ramos  HPI: Marcus Ramos is a 78 year old with diabetes, HTN, CAD and chronic systolic HF. He suffered a large anterolateral MI in 6/10 c/b cardiogenic shock. He was treated with emergent bare-metal stenting of the LAD, as well as a  drug-eluting stent to the circumflex. S/P PTCA RCA. Ejection fraction was 25% with global hypokinesis, and question of a left ventricular thrombus. He was placed on Coumadin as well as aspirin and Plavix. 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  Had echo 8/10 which confirmed EF 25-30%. MRI EF 23%. With full thickness scar. No signifcant viability.  We previously arranged for him to see Dr. Rayann Ramos to discuss ICD placement. He saw Dr. Rayann Ramos and was set-up for an ICD but he cancelled his implant appt saying he didn't want a device at that time.  ECHO 05/15/16 EF 20-25% apical AK RV ok.   He returns for f/u. Says he is doing well. Continues to do some work in the yard. Get tired easily but takes a break and feels better. Denies CP unless he is exerting himself very strenuously which is rare. Can walk on flat ground without problem but gets SOB if goes too fast. No change Joints hurt. On coumadin. No bleeding. Had urine test with high microalbumin   Labs: (01/21/14): Cholesterol 113, TG 242, HDL 26, LDL 39, Hgb A1C 6.2, Creatinine 1.04, GFR 72  Review of systems complete and found to be negative unless listed in HPI.    Past Medical History:  Diagnosis Date  . Embolic stroke ([place])   . Essential hypertension, benign   . Gout   . Hypopotassemia   . Obesity, unspecified   . 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 Stented coronary artery    Bare-metal stenting of 100% occluded left descending artery. Staged drug eluting  stenting of circumflex artery  . Type II or unspecified type diabetes mellitus without mention of complication, not stated as uncontrolled     Current Outpatient Medications  Medication Sig Dispense Refill  . allopurinol (ZYLOPRIM) 100 MG tablet TAKE 1 TABLET DAILY (NEED TO CALL TO SCHEDULE AN APPOINTMENT) 90 tablet 4  . atorvastatin (LIPITOR) 40 MG tablet TAKE 1 TABLET DAILY 90 tablet 4  . carvedilol (COREG) 6.25 MG tablet TAKE ONE AND ONE-HALF TABLETS TWICE A DAY (DOSAGE INCREASE) 270 tablet 0  . colchicine 0.6 MG tablet Take 0.6 mg by mouth as needed.      Marland Kitchen ENTRESTO 97-103 MG Take 1 tablet by mouth twice daily 60 tablet 0  . fish oil-omega-3 fatty acids 1000 MG capsule Take 2 g by mouth daily. 2 capsules daily.     . nitroGLYCERIN (NITROSTAT) 0.4 MG SL tablet Place 0.4 mg under the tongue every 5 (five) minutes as needed.      Marland Kitchen spironolactone (ALDACTONE) 25 MG tablet TAKE 1 TABLET DAILY 90 tablet 3  . warfarin (COUMADIN) 5 MG tablet TAKE AS DIRECTED BY COUMADIN CLINIC 100 tablet 4   No current facility-administered medications for this encounter.     Vitals:   12/24/18 1045  BP: 110/64  Pulse: 64  SpO2: 98%  Weight: 98.2 kg (216 lb 9.6 oz)    Wt Readings from Last 3 Encounters:  12/24/18 98.2 kg (216 lb 9.6 oz)  11/17/17 101.6 kg (224 lb)  05/15/17 100.5 kg (221 lb 8 oz)    PHYSICAL EXAM: General:  Well appearing. No resp difficulty HEENT: normal Neck: supple. no JVD. Carotids 2+ bilat; no bruits. No lymphadenopathy or thryomegaly appreciated. Cor: PMI nondisplaced. Regular rate & rhythm. No rubs, gallops or murmurs. Lungs: clear Abdomen: soft, nontender, nondistended. No hepatosplenomegaly. No bruits or masses. Good bowel sounds. Extremities: no cyanosis, clubbing, rash, edema Neuro: alert & orientedx3, cranial nerves grossly intact. moves all 4 extremities w/o difficulty. Affect pleasant   ECG: NSR 61 with anteroseptal Qs. Personally reviewed   ASSESSMENT & PLAN:   1. Chronic Systolic Heart Failure: ICM, EF 30-35% (11/2012) EF (1/18)  - Echo 1/18 20-25% RV ok  - Stable NYHA III - Volume status looks ok  - Continue lasix as needed.  - Continue carvedilol 6.25 mg BID.  (tried 9.375 but failed due to dizziness) - Continue Entresto 97/103. Can take lasix as needed - Continue Spironolactone 25 mg daily.  - Start Jardiance 10  - He has seen EP in past and he doesn't want ICD.  - Reinforced fluid restriction to < 2 L daily, sodium restriction to less than 2000 mg daily, and the importance of daily weights.   2. Hyperlipidemia - Per PCP. Continue Lipitor 40 mg daily. 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 Continue coumadin which [facility]. No change.  4. CAD - No significant ischemia - s/p multiple stents.  - Continue statin. LDL 45 Followed by Dr. Shelah LewandowskyElkin. -Off ASA with coumadin. 5. DM2 - Start Jardiance  Marcus Meresaniel [name], MD  11:12 AM

## 2018-12-24 NOTE — Patient Instructions (Signed)
Lab work done today. We will notify you of any abnormal lab work. No news is good news!  EKG done today.  RENEWED Entresto, refilled with [facility] refills to [facility].  START Jardiance 10mg . 1 tab daily.  Please follow up with the [facility] in 1 year. You will be put on a recall list. If you DO NOT hear from [place] by August 2021, please call us at [phone] option #3 in order to get that appointment scheduled.  At the [facility], you and your health needs are our priority. As part of our continuing mission to provide you with exceptional heart care, we have created designated Provider Care Teams. These Care Teams include your primary Cardiologist (physician) and Advanced Practice Providers (APPs- Physician Assistants and Nurse Practitioners) who all work together to provide you with the care you need, when you need it.   You may see any of the following providers on your designated Care Team at your next follow up: Marland Kitchen Dr Glori Bickers . Dr Loralie Champagne . Darrick Grinder, NP   Please be sure to bring in all your medications bottles to every appointment.

## 2019-01-05 ENCOUNTER — Telehealth (HOSPITAL_COMMUNITY): Payer: Self-pay | Admitting: Pharmacy Technician

## 2019-01-05 NOTE — Telephone Encounter (Signed)
Received a denial letter for [employer] from [employer]. Patient does not have part D coverage but has part B.   Started application through [employer] for him. Mailed application to patient and when he sends it back I will fax it in.  Charlann Boxer, CPhT

## 2019-01-14 NOTE — Telephone Encounter (Signed)
Patient sent back application, faxed in to [facility] today.  Phone [phone]  Fax [phone]  Will continue to follow.  Charlann Boxer, CPhT

## 2019-01-18 NOTE — Telephone Encounter (Signed)
Patient was approved to receive Jardiance through [facility].   Effective Dates: 01/16/2019 through 04/22/2019  Called and spoke with patient who was pleased with outcome. Gave him phone number to [facility] and advised him to call and set up his shipment.   Charlann Boxer, CPhT

## 2019-01-25 ENCOUNTER — Other Ambulatory Visit (HOSPITAL_COMMUNITY): Payer: Self-pay | Admitting: Internal Medicine

## 2019-01-28 ENCOUNTER — Ambulatory Visit (INDEPENDENT_AMBULATORY_CARE_PROVIDER_SITE_OTHER): Payer: Medicare Other | Admitting: *Deleted

## 2019-01-28 ENCOUNTER — Other Ambulatory Visit: Payer: Self-pay

## 2019-01-28 DIAGNOSIS — Z5181 Encounter for therapeutic drug level monitoring: Secondary | ICD-10-CM

## 2019-01-28 LAB — POCT INR: INR: 3.1 — AB (ref 2.0–3.0)

## 2019-01-28 NOTE — Patient Instructions (Signed)
Description   Today take 1/2 tablet then continue taking 1 tablet daily. Recheck in 8 weeks. Coumadin Clinic [phone]

## 2019-03-10 ENCOUNTER — Other Ambulatory Visit (HOSPITAL_COMMUNITY): Payer: Self-pay | Admitting: Internal Medicine

## 2019-03-13 ENCOUNTER — Other Ambulatory Visit (HOSPITAL_COMMUNITY): Payer: Self-pay | Admitting: Internal Medicine

## 2019-03-24 ENCOUNTER — Other Ambulatory Visit: Payer: Self-pay

## 2019-03-24 ENCOUNTER — Ambulatory Visit: Payer: Medicare Other | Admitting: *Deleted

## 2019-03-24 DIAGNOSIS — Z5181 Encounter for therapeutic drug level monitoring: Secondary | ICD-10-CM | POA: Diagnosis not present

## 2019-03-24 LAB — POCT INR: INR: 4.5 — AB (ref 2.0–3.0)

## 2019-03-24 NOTE — Patient Instructions (Signed)
Description   Do not take any Warfarin today and No Warfarin tomorrow then start taking 1 tablet daily except 1/2 tablet on Sundays. Recheck in 4 weeks. Coumadin Clinic [phone]

## 2019-04-19 ENCOUNTER — Encounter (INDEPENDENT_AMBULATORY_CARE_PROVIDER_SITE_OTHER): Payer: Self-pay

## 2019-04-19 ENCOUNTER — Ambulatory Visit: Payer: Medicare Other | Admitting: *Deleted

## 2019-04-19 ENCOUNTER — Other Ambulatory Visit: Payer: Self-pay

## 2019-04-19 DIAGNOSIS — Z5181 Encounter for therapeutic drug level monitoring: Secondary | ICD-10-CM

## 2019-04-19 LAB — POCT INR: INR: 3.4 — AB (ref 2.0–3.0)

## 2019-04-19 NOTE — Patient Instructions (Signed)
Description   Do not take any Warfarin today and then continue taking 1 tablet daily except 1/2 tablet on Sundays. Recheck in 3 weeks. Coumadin Clinic [phone]

## 2019-05-05 ENCOUNTER — Telehealth (HOSPITAL_COMMUNITY): Payer: Self-pay | Admitting: Pharmacist

## 2019-05-05 NOTE — Telephone Encounter (Signed)
Patient Advocate Encounter   Received notification from [employer] that prior authorization for Sherryll Burger is required.   PA submitted on CoverMyMeds Key [id number] Status is pending   Will continue to follow.  Karle Plumber, PharmD, BCPS, BCCP, CPP Heart Failure Clinic Pharmacist [phone]

## 2019-05-06 NOTE — Telephone Encounter (Signed)
PA for Marcus Ramos was denied because the insurance plan does not cover outpatient drugs. Left VM for patient to provide new insurance information so request can be resubmitted.   Karle Plumber, PharmD, BCPS, BCCP, CPP Heart Failure Clinic Pharmacist [phone]

## 2019-05-07 ENCOUNTER — Other Ambulatory Visit (HOSPITAL_COMMUNITY): Payer: Self-pay

## 2019-05-07 MED ORDER — ATORVASTATIN CALCIUM 40 MG PO TABS: 40.0000 mg | ORAL_TABLET | Freq: Every day | ORAL | 4 refills | Status: DC

## 2019-05-07 MED ORDER — SPIRONOLACTONE 25 MG PO TABS: 25.0000 mg | ORAL_TABLET | Freq: Every day | ORAL | 3 refills | Status: DC

## 2019-05-07 MED ORDER — ALLOPURINOL 100 MG PO TABS: ORAL_TABLET | ORAL | 4 refills | Status: DC

## 2019-05-07 NOTE — Telephone Encounter (Signed)
Called patient and received updated insurance information. Marcus Ramos was processed without need for a prior authorization.   Karle Plumber, PharmD, BCPS, BCCP, CPP Heart Failure Clinic Pharmacist [phone]

## 2019-05-09 ENCOUNTER — Ambulatory Visit: Payer: Medicare Other | Attending: Internal Medicine

## 2019-05-09 DIAGNOSIS — Z23 Encounter for immunization: Secondary | ICD-10-CM | POA: Insufficient documentation

## 2019-05-09 NOTE — Progress Notes (Signed)
   Covid-19 Vaccination Clinic  Name:  Marcus Ramos    MRN: [id number] DOB: 08/22/1940  05/09/2019  Marcus Ramos was observed post Covid-19 immunization for 30 MINS without incidence. He was provided with Vaccine Information Sheet and instruction to access the V-Safe system.   Marcus Ramos was instructed to call 911 with any severe reactions post vaccine: Marland Kitchen Difficulty breathing  . Swelling of your face and throat  . A fast heartbeat  . A bad rash all over your body  . Dizziness and weakness

## 2019-05-10 ENCOUNTER — Other Ambulatory Visit (HOSPITAL_COMMUNITY): Payer: Self-pay

## 2019-05-10 MED ORDER — CARVEDILOL 6.25 MG PO TABS: ORAL_TABLET | ORAL | 3 refills | Status: DC

## 2019-05-10 MED ORDER — WARFARIN SODIUM 5 MG PO TABS: ORAL_TABLET | ORAL | 4 refills | Status: DC

## 2019-05-11 ENCOUNTER — Ambulatory Visit (INDEPENDENT_AMBULATORY_CARE_PROVIDER_SITE_OTHER): Payer: Medicare Other | Admitting: *Deleted

## 2019-05-11 ENCOUNTER — Other Ambulatory Visit: Payer: Self-pay

## 2019-05-11 DIAGNOSIS — Z5181 Encounter for therapeutic drug level monitoring: Secondary | ICD-10-CM

## 2019-05-11 LAB — POCT INR: INR: 2.8 (ref 2.0–3.0)

## 2019-05-11 NOTE — Patient Instructions (Signed)
Description   Continue taking 1 tablet daily except 1/2 tablet on Sundays. Recheck in 4 weeks. Coumadin Clinic [phone]

## 2019-05-30 ENCOUNTER — Ambulatory Visit: Payer: Medicare Other | Attending: Internal Medicine

## 2019-05-30 DIAGNOSIS — Z23 Encounter for immunization: Secondary | ICD-10-CM | POA: Insufficient documentation

## 2019-05-30 NOTE — Progress Notes (Signed)
   Covid-19 Vaccination Clinic  Name:  HELMUTH RECUPERO    MRN: [id number] DOB: [birth year]  05/30/2019  Mr. Simones was observed post Covid-19 immunization for 15 minutes without incidence. He was provided with Vaccine Information Sheet and instruction to access the V-Safe system.   Mr. Rudin was instructed to call 911 with any severe reactions post vaccine: Marland Kitchen Difficulty breathing  . Swelling of your face and throat  . A fast heartbeat  . A bad rash all over your body  . Dizziness and weakness    Immunizations Administered    Name Date Dose VIS Date Route   Pfizer COVID-19 Vaccine 05/30/2019 10:58 AM 0.3 mL 04/02/2019 Intramuscular   Manufacturer: [employer], [employer]   Lot: [id number]   NDC: [address]-6

## 2019-06-09 ENCOUNTER — Ambulatory Visit (INDEPENDENT_AMBULATORY_CARE_PROVIDER_SITE_OTHER): Payer: Medicare Other | Admitting: *Deleted

## 2019-06-09 ENCOUNTER — Other Ambulatory Visit: Payer: Self-pay

## 2019-06-09 DIAGNOSIS — Z5181 Encounter for therapeutic drug level monitoring: Secondary | ICD-10-CM | POA: Diagnosis not present

## 2019-06-09 LAB — POCT INR: INR: 2.6 (ref 2.0–3.0)

## 2019-06-09 NOTE — Patient Instructions (Signed)
Description   Continue taking 1 tablet daily except 1/2 tablet on Sundays. Recheck in 5 weeks. Coumadin Clinic [phone]

## 2019-06-17 ENCOUNTER — Telehealth (HOSPITAL_COMMUNITY): Payer: Self-pay | Admitting: Pharmacist

## 2019-06-17 MED ORDER — EMPAGLIFLOZIN 10 MG PO TABS: 10.0000 mg | ORAL_TABLET | Freq: Every day | ORAL | 11 refills | Status: DC

## 2019-06-17 NOTE — Telephone Encounter (Signed)
Received message from [facility] that patient has been denied Jardiance assistance due to being over the income limit.  However, after talking to the patient, it appears he has Medicare part A/B for medical insurance, but has [employer] for his outpatient prescription medications. When the claim was processed, he was found to have a $50.00 copay. 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  [facility]ney Killian: [id number], group: [id number], PCN: [id number], ID: [id number]  Copay card:  RxBIN: [id number] RxPCN: Loyalty RxGRP: [id number] ISSUER: [id number]) ID: [id number]  Karle Plumber, PharmD, BCPS, BCCP, CPP Heart Failure Clinic Pharmacist [phone]

## 2019-07-15 ENCOUNTER — Encounter (INDEPENDENT_AMBULATORY_CARE_PROVIDER_SITE_OTHER): Payer: Self-pay

## 2019-07-15 ENCOUNTER — Other Ambulatory Visit: Payer: Self-pay

## 2019-07-15 ENCOUNTER — Ambulatory Visit: Payer: Medicare Other | Admitting: *Deleted

## 2019-07-15 DIAGNOSIS — Z5181 Encounter for therapeutic drug level monitoring: Secondary | ICD-10-CM

## 2019-07-15 LAB — POCT INR: INR: 1.7 — AB (ref 2.0–3.0)

## 2019-07-15 NOTE — Patient Instructions (Signed)
Description   Take 1.5 tablets today, then continue taking 1 tablet daily except 1/2 tablet on Sundays. Recheck in 4 weeks. Coumadin Clinic [phone]

## 2019-08-12 ENCOUNTER — Ambulatory Visit: Payer: Medicare Other | Admitting: *Deleted

## 2019-08-12 ENCOUNTER — Other Ambulatory Visit: Payer: Self-pay

## 2019-08-12 DIAGNOSIS — Z5181 Encounter for therapeutic drug level monitoring: Secondary | ICD-10-CM | POA: Diagnosis not present

## 2019-08-12 LAB — POCT INR: INR: 2.3 (ref 2.0–3.0)

## 2019-08-12 NOTE — Patient Instructions (Signed)
Description   Continue taking 1 tablet daily except 1/2 tablet on Sundays. Recheck in 5 weeks. [facility]in Clinic [phone]    

## 2019-09-16 ENCOUNTER — Other Ambulatory Visit: Payer: Self-pay

## 2019-09-16 ENCOUNTER — Ambulatory Visit (INDEPENDENT_AMBULATORY_CARE_PROVIDER_SITE_OTHER): Payer: Medicare Other | Admitting: *Deleted

## 2019-09-16 DIAGNOSIS — Z5181 Encounter for therapeutic drug level monitoring: Secondary | ICD-10-CM | POA: Diagnosis not present

## 2019-09-16 DIAGNOSIS — Z7901 Long term (current) use of anticoagulants: Secondary | ICD-10-CM | POA: Diagnosis not present

## 2019-09-16 LAB — POCT INR: INR: 2 (ref 2.0–3.0)

## 2019-09-16 NOTE — Patient Instructions (Signed)
Description   Continue taking 1 tablet daily except 1/2 tablet on Sundays. Recheck in 5 weeks. [facility]in Clinic [phone]

## 2019-10-22 ENCOUNTER — Ambulatory Visit: Payer: Medicare Other | Admitting: *Deleted

## 2019-10-22 ENCOUNTER — Other Ambulatory Visit: Payer: Self-pay

## 2019-10-22 DIAGNOSIS — Z5181 Encounter for therapeutic drug level monitoring: Secondary | ICD-10-CM

## 2019-10-22 LAB — POCT INR: INR: 1.8 — AB (ref 2.0–3.0)

## 2019-10-22 NOTE — Patient Instructions (Signed)
Description   Take 1.5 tablets today, then start taking 1 tablet daily. Recheck in 3 weeks. [facility]in Clinic [phone]

## 2019-11-16 ENCOUNTER — Ambulatory Visit: Payer: Medicare Other

## 2019-11-16 ENCOUNTER — Other Ambulatory Visit: Payer: Self-pay

## 2019-11-16 DIAGNOSIS — Z5181 Encounter for therapeutic drug level monitoring: Secondary | ICD-10-CM

## 2019-11-16 LAB — POCT INR: INR: 2.1 (ref 2.0–3.0)

## 2019-11-16 NOTE — Patient Instructions (Signed)
Description   Continue on same dosage 1 tablet daily.  Recheck in 4 weeks. [facility]in Clinic [phone]     

## 2019-12-08 ENCOUNTER — Other Ambulatory Visit: Payer: Self-pay | Admitting: *Deleted

## 2019-12-08 MED ORDER — WARFARIN SODIUM 5 MG PO TABS: ORAL_TABLET | ORAL | 1 refills | Status: DC

## 2019-12-14 ENCOUNTER — Other Ambulatory Visit: Payer: Self-pay

## 2019-12-14 ENCOUNTER — Ambulatory Visit: Payer: Medicare Other

## 2019-12-14 DIAGNOSIS — Z5181 Encounter for therapeutic drug level monitoring: Secondary | ICD-10-CM | POA: Diagnosis not present

## 2019-12-14 LAB — POCT INR: INR: 2.6 (ref 2.0–3.0)

## 2019-12-14 NOTE — Patient Instructions (Signed)
Description   Continue on same dosage 1 tablet daily. Recheck in 6 weeks. [facility]in Clinic [phone]

## 2020-01-12 ENCOUNTER — Other Ambulatory Visit: Payer: Self-pay | Admitting: *Deleted

## 2020-01-12 MED ORDER — WARFARIN SODIUM 5 MG PO TABS: ORAL_TABLET | ORAL | 1 refills | Status: DC

## 2020-01-12 NOTE — Telephone Encounter (Signed)
Pt called and stated he needed a  Warfarin refill. Warfarin refill sent 12/08/2019 at 237pm to [facility] and pt states he never received and they state it was not received per [facility] so sent in another one for the pt. He will call back if he needs a local supply.

## 2020-01-19 ENCOUNTER — Telehealth (HOSPITAL_COMMUNITY): Payer: Self-pay | Admitting: Pharmacist

## 2020-01-19 MED ORDER — ENTRESTO 97-103 MG PO TABS: 1.0000 | ORAL_TABLET | Freq: Two times a day (BID) | ORAL | 11 refills | Status: DC

## 2020-01-19 NOTE — Telephone Encounter (Signed)
Entresto refill sent to [facility] per patient request.   Karle Plumber, PharmD, BCPS, BCCP, CPP Heart Failure Clinic Pharmacist [phone]

## 2020-01-25 ENCOUNTER — Other Ambulatory Visit: Payer: Self-pay

## 2020-01-25 ENCOUNTER — Ambulatory Visit (INDEPENDENT_AMBULATORY_CARE_PROVIDER_SITE_OTHER): Payer: Medicare Other | Admitting: *Deleted

## 2020-01-25 DIAGNOSIS — Z5181 Encounter for therapeutic drug level monitoring: Secondary | ICD-10-CM | POA: Diagnosis not present

## 2020-01-25 LAB — POCT INR: INR: 2.9 (ref 2.0–3.0)

## 2020-01-25 NOTE — Patient Instructions (Signed)
Description   Continue taking Warfarin 1 tablet daily. Recheck in 6 weeks. Coumadin Clinic [phone]     

## 2020-02-22 NOTE — Progress Notes (Signed)
Advanced [facility] Failure Clinic Note   Patient ID: Marcus Ramos, male   DOB: 02/01/1941, 79 y.o.   MRN: [id number]  PCP: Dr. Windle Ramos Primary Cardiologist: Dr. Gala Ramos  HPI: Marcus Ramos is a 79 year old with DM2, HTN, CAD and chronic systolic HF. He suffered a large anterolateral MI in 6/10 c/b cardiogenic shock. He was treated with emergent bare-metal stenting of the LAD, as well as a  drug-eluting stent to the circumflex. S/P PTCA RCA. Ejection fraction was 25% with global hypokinesis, and question of a left ventricular thrombus. He was placed on Coumadin as well as aspirin and Plavix. 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  Had echo 8/10 which confirmed EF 25-30%. MRI 2010 EF 23%. With full thickness scar. No signifcant viability.  We previously arranged for him to see Dr. Johney Ramos to discuss ICD placement. He saw Dr. Johney Ramos and was set-up for an ICD but he cancelled his implant appt saying he didn't want a device at that time.  ECHO 05/15/16 EF 20-25% apical AK RV ok.   He returns for f/u. Says he is doing well. Remains active around the house and yard. Gardening and deer hunting. No CP, edema, orthopnea or PND. Mild exertional dyspnea. No problems with meds.   Review of systems complete and found to be negative unless listed in HPI.    Past Medical History:  Diagnosis Date  . Embolic stroke (HCC)   . Essential hypertension, benign   . Gout   . Hypopotassemia   . Obesity, unspecified   . 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 Stented coronary artery    Bare-metal stenting of 100% occluded left descending artery. Staged drug eluting stenting of circumflex artery  . Type II or unspecified type diabetes mellitus without mention of complication, not stated as uncontrolled     Current Outpatient Medications  Medication Sig Dispense Refill  . allopurinol (ZYLOPRIM) 100 MG tablet Take 1 Tablet Daily 90  tablet 4  . atorvastatin (LIPITOR) 40 MG tablet Take 1 tablet (40 mg total) by mouth daily. 90 tablet 4  . carvedilol (COREG) 6.25 MG tablet TAKE ONE AND ONE-HALF TABLETS TWICE A DAY (DOSAGE INCREASE) 270 tablet 3  . colchicine 0.6 MG tablet Take 0.6 mg by mouth as needed.      . empagliflozin (JARDIANCE) 10 MG TABS tablet Take 10 mg by mouth daily before breakfast. 30 tablet 11  . fish oil-omega-3 fatty acids 1000 MG capsule Take 2 g by mouth daily. 2 capsules daily.     . nitroGLYCERIN (NITROSTAT) 0.4 MG SL tablet Place 0.4 mg under the tongue every 5 (five) minutes as needed.      . sacubitril-valsartan (ENTRESTO) 97-103 MG Take 1 tablet by mouth 2 (two) times daily. 60 tablet 11  . spironolactone (ALDACTONE) 25 MG tablet Take 1 tablet (25 mg total) by mouth daily. 90 tablet 3  . warfarin (COUMADIN) 5 MG tablet Take as Directed By the Coumadin Clinic 100 tablet 1   No current facility-administered medications for this encounter.    Vitals:   02/24/20 1002  BP: 120/70  Pulse: 76  SpO2: 97%  Weight: 96.8 kg (213 lb 6.4 oz)    Wt Readings from Last 3 Encounters:  12/24/18 98.2 kg (216 lb 9.6 oz)  11/17/17 101.6 kg (224 lb)  05/15/17 100.5 kg (221 lb 8 oz)    PHYSICAL EXAM: General:  Well appearing. No resp difficulty HEENT:  normal Neck: supple. no JVD. Carotids 2+ bilat; no bruits. No lymphadenopathy or thryomegaly appreciated. Cor: PMI nondisplaced. Regular rate & rhythm. No rubs, gallops or murmurs. Lungs: clear Abdomen: soft, nontender, nondistended. No hepatosplenomegaly. No bruits or masses. Good bowel sounds. Extremities: no cyanosis, clubbing, rash, edema Neuro: alert & orientedx3, cranial nerves grossly intact. moves all 4 extremities w/o difficulty. Affect pleasant   ECG: NSR 61 with anteroseptal Qs. Personally reviewed   ASSESSMENT & PLAN:  1. Chronic Systolic Heart Failure: ICM, EF 30-35% (11/2012) EF (1/18)  - Echo 1/18 20-25% RV ok  - Stable NYHA II-III -  Volume status looks good.  - Continue lasix as needed.  - Continue carvedilol 6.25 mg BID.  (tried 9.375 but failed due to dizziness) - Continue Entresto 97/103. Can take lasix as needed - Continue Spironolactone 25 mg daily.  - Continue Jardiance 10  - He has seen EP in past and he doesn't want ICD.  - Needs repeat ECHO  2. Hyperlipidemia - Per PCP. Continue Lipitor 40 mg daily. 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 Continue coumadin which is [facility] at [facility].  - No bleeding  4. CAD - s/p large anterior MI in 2010 - No s/s ischemia - s/p multiple stents.  - Continue statin. LDL 64 Followed by Dr. Shelah Ramos. -Off ASA with coumadin. 5. DM2 - Continue Jardiance - Recent hgba1c 6.5  Marcus Meres, MD  11:16 PM

## 2020-02-24 ENCOUNTER — Other Ambulatory Visit: Payer: Self-pay

## 2020-02-24 ENCOUNTER — Encounter (HOSPITAL_COMMUNITY): Payer: Self-pay | Admitting: Internal Medicine

## 2020-02-24 ENCOUNTER — Ambulatory Visit (HOSPITAL_COMMUNITY)
Admission: RE | Admit: 2020-02-24 | Discharge: 2020-02-24 | Disposition: A | Discharge status: Home / Self Care | Payer: Medicare Other | Source: Ambulatory Visit | Attending: Internal Medicine | Admitting: Internal Medicine

## 2020-02-24 VITALS — BP 120/70 | HR 76 | Wt 213.4 lb

## 2020-02-24 DIAGNOSIS — Z955 Presence of coronary angioplasty implant and graft: Secondary | ICD-10-CM | POA: Diagnosis not present

## 2020-02-24 DIAGNOSIS — E785 Hyperlipidemia, unspecified: Secondary | ICD-10-CM | POA: Diagnosis not present

## 2020-02-24 DIAGNOSIS — I240 Acute coronary thrombosis not resulting in myocardial infarction: Secondary | ICD-10-CM | POA: Diagnosis not present

## 2020-02-24 DIAGNOSIS — I110 Hypertensive heart disease with heart failure: Secondary | ICD-10-CM | POA: Insufficient documentation

## 2020-02-24 DIAGNOSIS — Z8673 Personal history of transient ischemic attack (TIA), and cerebral infarction without residual deficits: Secondary | ICD-10-CM | POA: Diagnosis not present

## 2020-02-24 DIAGNOSIS — I252 Old myocardial infarction: Secondary | ICD-10-CM | POA: Diagnosis not present

## 2020-02-24 DIAGNOSIS — Z79899 Other long term (current) drug therapy: Secondary | ICD-10-CM | POA: Diagnosis not present

## 2020-02-24 DIAGNOSIS — I2510 Atherosclerotic heart disease of native coronary artery without angina pectoris: Secondary | ICD-10-CM | POA: Diagnosis not present

## 2020-02-24 DIAGNOSIS — E119 Type 2 diabetes mellitus without complications: Secondary | ICD-10-CM | POA: Diagnosis not present

## 2020-02-24 DIAGNOSIS — I5022 Chronic systolic (congestive) heart failure: Secondary | ICD-10-CM | POA: Insufficient documentation

## 2020-02-24 HISTORY — DX: Heart failure, unspecified: I50.9

## 2020-02-24 NOTE — Addendum Note (Signed)
Encounter addended by: Noralee Space, RN on: 02/24/2020 11:05 AM  Actions taken: Order list changed, Diagnosis association updated, Clinical Note Signed

## 2020-02-24 NOTE — Patient Instructions (Signed)
Your physician has requested that you have an echocardiogram. Echocardiography is a painless test that uses sound waves to create images of your heart. It provides your doctor with information about the size and shape of your heart and how well your heart's chambers and valves are working. This procedure takes approximately one hour. There are no restrictions for this procedure.  Please call our office in October 2022 to schedule your yearly follow up  If you have any questions or concerns before your next appointment please send [place] a message through [place] or call our office at [phone].    TO LEAVE A MESSAGE FOR THE NURSE SELECT OPTION 2, PLEASE LEAVE A MESSAGE INCLUDING: . YOUR NAME . DATE OF BIRTH . CALL BACK NUMBER . REASON FOR CALL**this is important as we prioritize the call backs  YOU WILL RECEIVE A CALL BACK THE SAME DAY AS LONG AS YOU CALL BEFORE 4:00 PM  At the [facility], you and your health needs are our priority. As part of our continuing mission to provide you with exceptional heart care, we have created designated Provider Care Teams. These Care Teams include your primary Cardiologist (physician) and Advanced Practice Providers (APPs- Physician Assistants and Nurse Practitioners) who all work together to provide you with the care you need, when you need it.   You may see any of the following providers on your designated Care Team at your next follow up: Marland Kitchen Dr Arvilla Meres . Dr Marca Ancona . Tonye Becket, NP . Robbie Lis, PA . Karle Plumber, PharmD   Please be sure to bring in all your medications bottles to every appointment.

## 2020-03-06 ENCOUNTER — Other Ambulatory Visit: Payer: Self-pay

## 2020-03-06 ENCOUNTER — Ambulatory Visit (HOSPITAL_COMMUNITY)
Admission: RE | Admit: 2020-03-06 | Discharge: 2020-03-06 | Disposition: A | Discharge status: Home / Self Care | Payer: Medicare Other | Source: Ambulatory Visit | Attending: Family Medicine | Admitting: Family Medicine

## 2020-03-06 DIAGNOSIS — I2510 Atherosclerotic heart disease of native coronary artery without angina pectoris: Secondary | ICD-10-CM | POA: Insufficient documentation

## 2020-03-06 DIAGNOSIS — I110 Hypertensive heart disease with heart failure: Secondary | ICD-10-CM | POA: Insufficient documentation

## 2020-03-06 DIAGNOSIS — E119 Type 2 diabetes mellitus without complications: Secondary | ICD-10-CM | POA: Insufficient documentation

## 2020-03-06 DIAGNOSIS — I5022 Chronic systolic (congestive) heart failure: Secondary | ICD-10-CM | POA: Diagnosis not present

## 2020-03-06 DIAGNOSIS — Z8673 Personal history of transient ischemic attack (TIA), and cerebral infarction without residual deficits: Secondary | ICD-10-CM | POA: Diagnosis not present

## 2020-03-06 MED ORDER — PERFLUTREN LIPID MICROSPHERE
1.0000 mL | INTRAVENOUS | Status: AC | PRN
  Administered 2020-03-06: 2 mL via INTRAVENOUS
  Filled 2020-03-06: qty 10

## 2020-03-06 NOTE — Progress Notes (Signed)
  Echocardiogram 2D Echocardiogram with definity has been performed.  Leta Jungling M 03/06/2020, 11:42 AM

## 2020-03-07 ENCOUNTER — Ambulatory Visit: Payer: Medicare Other | Admitting: *Deleted

## 2020-03-07 DIAGNOSIS — Z5181 Encounter for therapeutic drug level monitoring: Secondary | ICD-10-CM

## 2020-03-07 LAB — ECHOCARDIOGRAM COMPLETE
Area-P 1/2: 2.22 cm2
Calc EF: 23.3 %
Single Plane A2C EF: 8.8 %
Single Plane A4C EF: 31.4 %

## 2020-03-07 LAB — POCT INR: INR: 2.7 (ref 2.0–3.0)

## 2020-03-07 NOTE — Patient Instructions (Signed)
Description   Continue taking Warfarin 1 tablet daily. Recheck in 7 weeks. Coumadin Clinic [phone]

## 2020-04-02 IMAGING — US US SCROTUM W/ DOPPLER COMPLETE
1 series · 13 of 25 positions shown · non-contrast
Comparison: None

CLINICAL DATA: Scrotal mass

EXAM:
SCROTAL ULTRASOUND
DOPPLER ULTRASOUND OF THE TESTICLES
TECHNIQUE: Complete ultrasound examination of the testicles, epididymis, and
other scrotal structures was performed. Color and spectral Doppler
ultrasound were also utilized to evaluate blood flow to the
testicles.

[Series 1: us scrotum w/ doppler complete · 0.07mm/px · 13 of 56 slices shown]
[im 1/56]
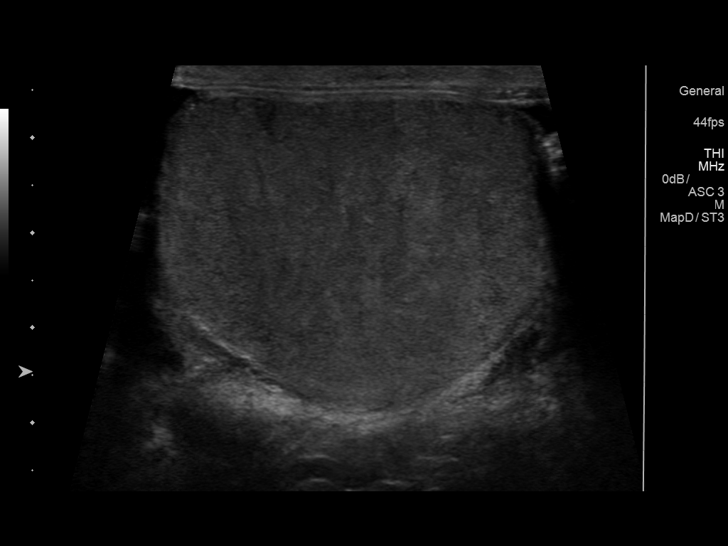
[im 5/56]
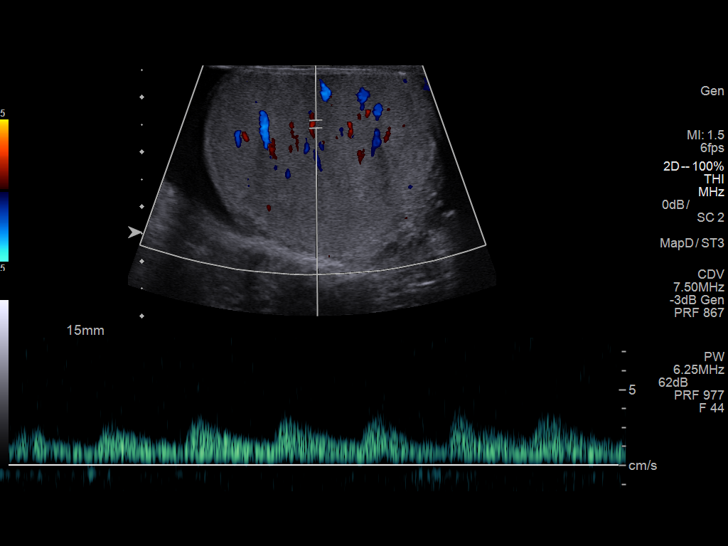
[im 10/56]
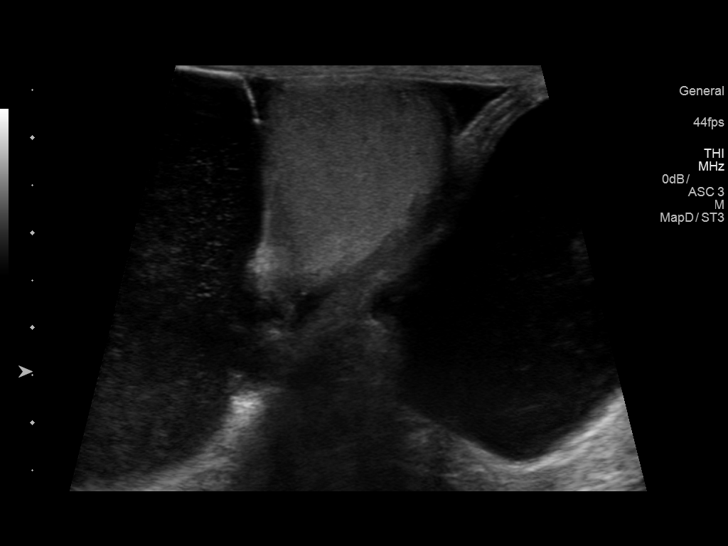
[im 14/56]
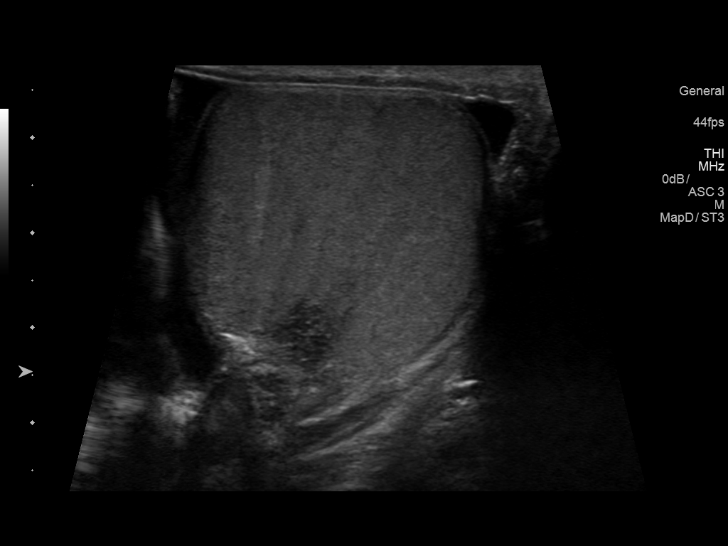
[im 19/56]
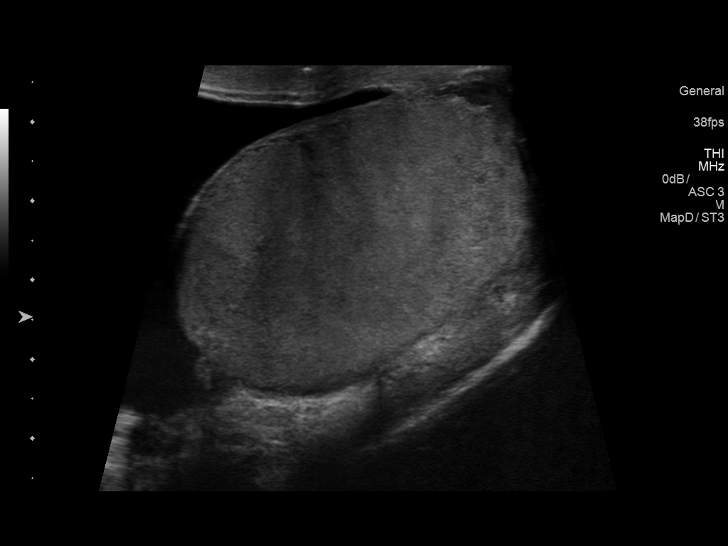
[im 23/56]
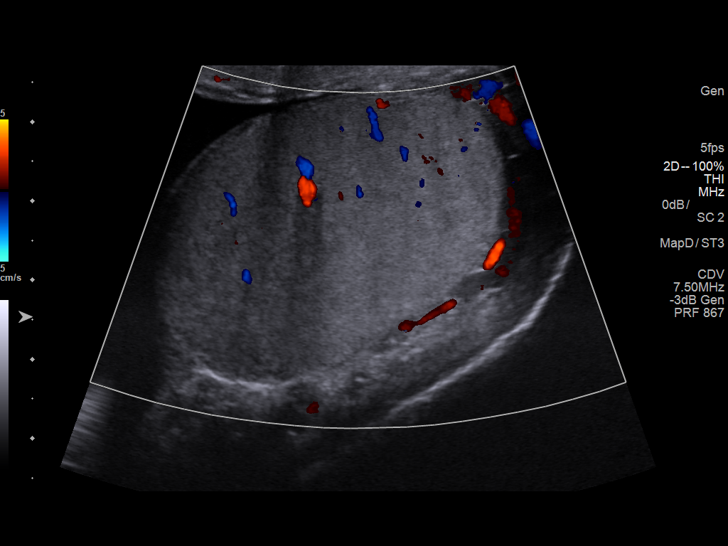
[im 28/56]
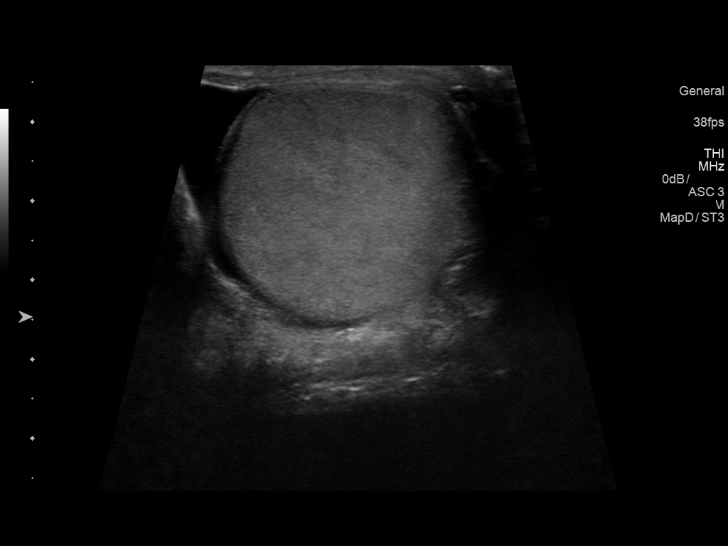
[im 33/56]
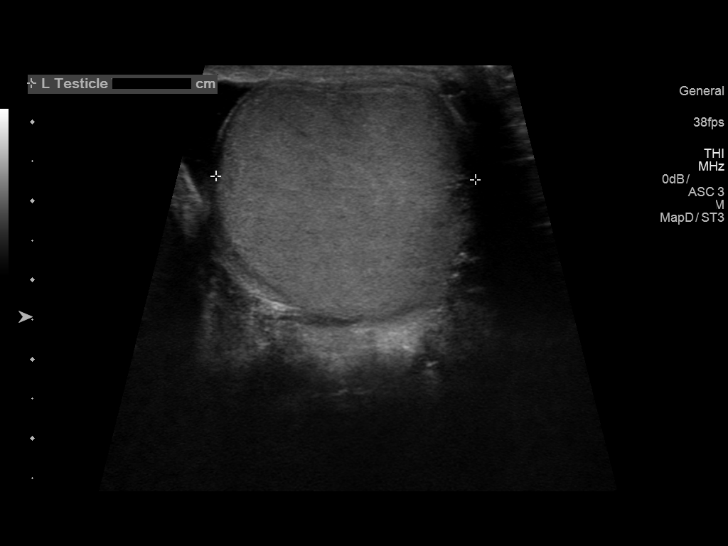
[im 37/56]
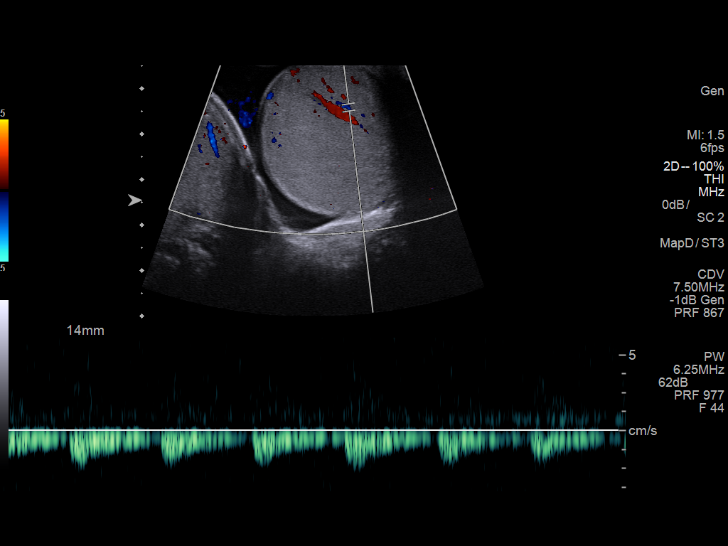
[im 42/56]
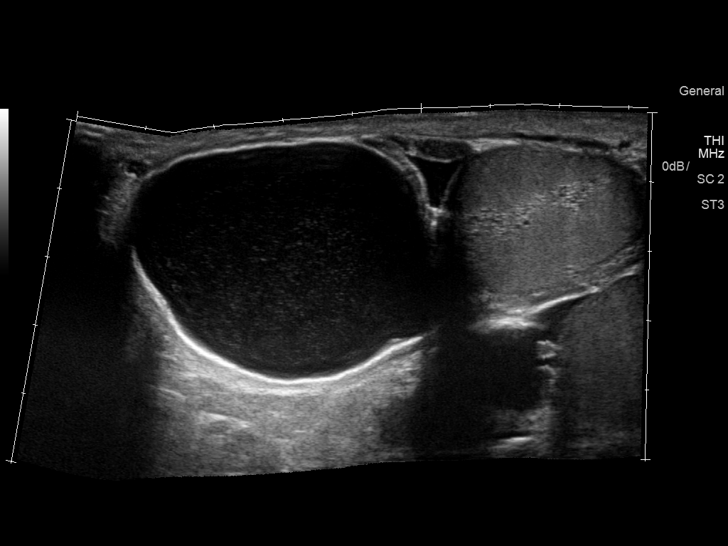
[im 46/56]
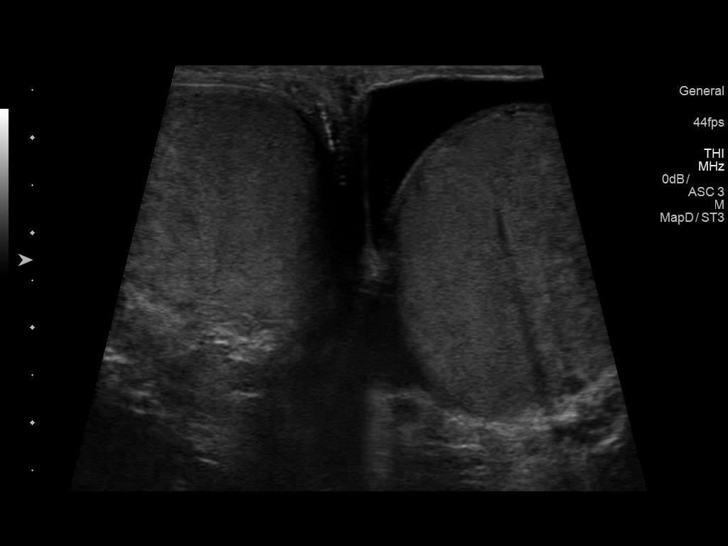
[im 51/56]
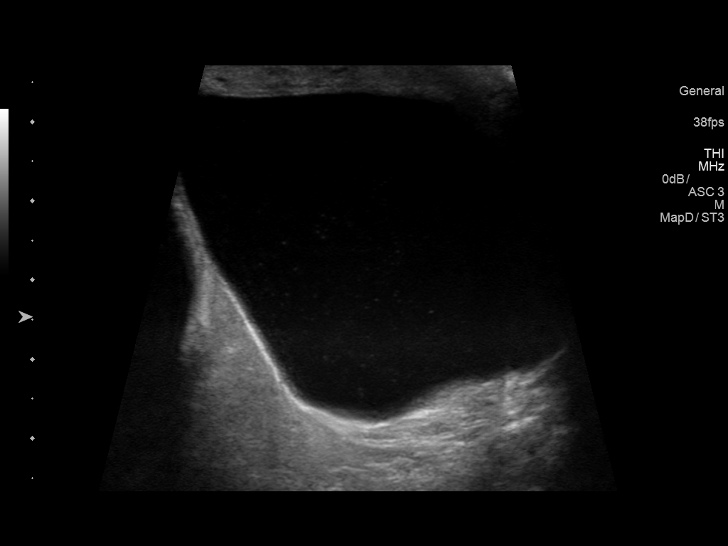
[im 56/56]
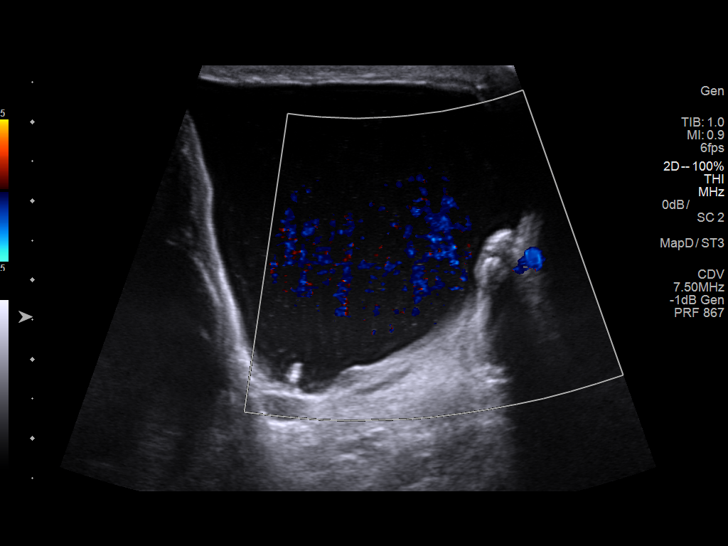

[13 of 25 positions shown; findings below may reference images not displayed]

FINDINGS: Right testicle

Measurements: 4.1 x 2.9 x 3.2 cm. Normal morphology without mass or
calcification. Internal blood flow present on color Doppler imaging.

Left testicle

Measurements: 4.7 x 3.0 x 3.3 cm. Normal morphology without mass or
calcification. Internal blood flow present on color Doppler imaging,
symmetric with RIGHT

Right epididymis: Short portion of the RIGHT epididymal tail is
questionably visualized. Large mass identified at the expected
position of the RIGHT epididymal head, a complicated cyst 4.3 x
x 4.5 cm in size containing low level internal echogenicity question
spermatocele, loculated hydrocele, or epididymal cyst.

Left epididymis:  Not visualized

Hydrocele: LEFT hydrocele present, mildly complicated by scattered
internal echoes. Few calcifications along the LEFT scrotal wall.
Small RIGHT hydrocele.

Varicocele:  Absent bilaterally

Pulsed Doppler interrogation of both testes demonstrates normal low
resistance arterial waveforms bilaterally, with venous waveforms
less well demonstrated.
IMPRESSION: Normal appearing testes.

Large complex cystic mass superior to the RIGHT testis 4.3 x 3.8 x
4.5 cm in size containing scattered low level internal echogenicity,
likely arising from the RIGHT epididymal head, question
spermatocele, loculated hydrocele or epididymal cyst.

Inadequate visualization of the epididymi otherwise.

## 2020-04-26 ENCOUNTER — Ambulatory Visit (INDEPENDENT_AMBULATORY_CARE_PROVIDER_SITE_OTHER): Payer: Medicare Other | Admitting: *Deleted

## 2020-04-26 ENCOUNTER — Other Ambulatory Visit: Payer: Self-pay

## 2020-04-26 DIAGNOSIS — Z5181 Encounter for therapeutic drug level monitoring: Secondary | ICD-10-CM

## 2020-04-26 LAB — POCT INR: INR: 3.6 — AB (ref 2.0–3.0)

## 2020-04-26 NOTE — Patient Instructions (Signed)
Description   Do not take any Warfarin today then continue taking Warfarin 1 tablet daily. Recheck in 4 weeks. Coumadin Clinic [phone]

## 2020-05-14 ENCOUNTER — Other Ambulatory Visit (HOSPITAL_COMMUNITY): Payer: Self-pay | Admitting: Internal Medicine

## 2020-05-15 MED ORDER — EMPAGLIFLOZIN 10 MG PO TABS: ORAL_TABLET | ORAL | 0 refills | Status: DC

## 2020-05-15 NOTE — Addendum Note (Signed)
Addended by: Rob Bunting R on: 05/15/2020 03:23 PM   Modules accepted: Orders

## 2020-05-24 ENCOUNTER — Other Ambulatory Visit: Payer: Self-pay

## 2020-05-24 ENCOUNTER — Ambulatory Visit: Payer: Medicare Other

## 2020-05-24 DIAGNOSIS — I6350 Cerebral infarction due to unspecified occlusion or stenosis of unspecified cerebral artery: Secondary | ICD-10-CM

## 2020-05-24 DIAGNOSIS — Z5181 Encounter for therapeutic drug level monitoring: Secondary | ICD-10-CM | POA: Diagnosis not present

## 2020-05-24 LAB — POCT INR: INR: 2.8 (ref 2.0–3.0)

## 2020-05-24 NOTE — Patient Instructions (Signed)
Description   Continue on same dosage of Warfarin 1 tablet daily. Recheck in 5 weeks. Coumadin Clinic [phone]

## 2020-05-25 ENCOUNTER — Other Ambulatory Visit (HOSPITAL_COMMUNITY): Payer: Self-pay | Admitting: *Deleted

## 2020-05-25 MED ORDER — ALLOPURINOL 100 MG PO TABS: ORAL_TABLET | ORAL | 4 refills | Status: DC

## 2020-05-25 MED ORDER — ATORVASTATIN CALCIUM 40 MG PO TABS: 40.0000 mg | ORAL_TABLET | Freq: Every day | ORAL | 4 refills | Status: DC

## 2020-06-21 ENCOUNTER — Other Ambulatory Visit (HOSPITAL_COMMUNITY): Payer: Self-pay | Admitting: Internal Medicine

## 2020-06-26 ENCOUNTER — Other Ambulatory Visit (HOSPITAL_COMMUNITY): Payer: Self-pay | Admitting: *Deleted

## 2020-06-26 ENCOUNTER — Telehealth (HOSPITAL_COMMUNITY): Payer: Self-pay | Admitting: Pharmacy Technician

## 2020-06-26 MED ORDER — ALLOPURINOL 100 MG PO TABS: ORAL_TABLET | ORAL | 4 refills | Status: DC

## 2020-06-26 MED ORDER — ATORVASTATIN CALCIUM 40 MG PO TABS: 40.0000 mg | ORAL_TABLET | Freq: Every day | ORAL | 4 refills | Status: DC

## 2020-06-26 NOTE — Telephone Encounter (Signed)
[facility] was requesting an updated RX for Allopurinol and Atorvastatin. He previously received these prescriptions from [facility] delivery.   Sent Jasmine (CMA), request for updated prescriptions.  Archer Asa, CPhT

## 2020-06-28 ENCOUNTER — Other Ambulatory Visit: Payer: Self-pay

## 2020-06-28 ENCOUNTER — Encounter (INDEPENDENT_AMBULATORY_CARE_PROVIDER_SITE_OTHER): Payer: Self-pay

## 2020-06-28 ENCOUNTER — Ambulatory Visit: Payer: Medicare Other | Admitting: *Deleted

## 2020-06-28 DIAGNOSIS — Z5181 Encounter for therapeutic drug level monitoring: Secondary | ICD-10-CM

## 2020-06-28 LAB — POCT INR: INR: 3 (ref 2.0–3.0)

## 2020-06-28 NOTE — Patient Instructions (Addendum)
  Description   Continue on same dosage of Warfarin 1 tablet daily. Recheck in 6 weeks. Coumadin Clinic [phone]

## 2020-06-30 ENCOUNTER — Other Ambulatory Visit: Payer: Self-pay | Admitting: Internal Medicine

## 2020-08-10 ENCOUNTER — Ambulatory Visit (INDEPENDENT_AMBULATORY_CARE_PROVIDER_SITE_OTHER): Payer: Medicare Other | Admitting: *Deleted

## 2020-08-10 ENCOUNTER — Other Ambulatory Visit: Payer: Self-pay

## 2020-08-10 DIAGNOSIS — Z5181 Encounter for therapeutic drug level monitoring: Secondary | ICD-10-CM | POA: Diagnosis not present

## 2020-08-10 LAB — POCT INR: INR: 2.9 (ref 2.0–3.0)

## 2020-08-10 NOTE — Patient Instructions (Signed)
Description   Continue taking Warfarin 1 tablet daily. Recheck in 6 weeks. Coumadin Clinic [phone]

## 2020-09-20 ENCOUNTER — Other Ambulatory Visit: Payer: Self-pay

## 2020-09-20 ENCOUNTER — Ambulatory Visit (INDEPENDENT_AMBULATORY_CARE_PROVIDER_SITE_OTHER): Payer: Medicare Other | Admitting: *Deleted

## 2020-09-20 DIAGNOSIS — Z5181 Encounter for therapeutic drug level monitoring: Secondary | ICD-10-CM | POA: Diagnosis not present

## 2020-09-20 LAB — POCT INR: INR: 4.4 — AB (ref 2.0–3.0)

## 2020-09-20 NOTE — Patient Instructions (Signed)
Description   Hold warfarin today and tomorrow, then continue taking Warfarin 1 tablet daily. Recheck in 2 weeks. Coumadin Clinic [phone]

## 2020-10-06 ENCOUNTER — Other Ambulatory Visit: Payer: Self-pay

## 2020-10-06 ENCOUNTER — Ambulatory Visit: Payer: Medicare Other | Admitting: Pharmacist

## 2020-10-06 DIAGNOSIS — Z5181 Encounter for therapeutic drug level monitoring: Secondary | ICD-10-CM | POA: Diagnosis not present

## 2020-10-06 LAB — POCT INR: INR: 3.1 — AB (ref 2.0–3.0)

## 2020-10-06 NOTE — Patient Instructions (Signed)
Description   Take 1/2 tablet today, then continue taking Warfarin 1 tablet daily. Recheck in 3 weeks. Stay consistent with your greens. Coumadin Clinic [phone]

## 2020-10-09 ENCOUNTER — Other Ambulatory Visit (HOSPITAL_COMMUNITY): Payer: Self-pay | Admitting: *Deleted

## 2020-10-09 ENCOUNTER — Other Ambulatory Visit (HOSPITAL_COMMUNITY): Payer: Self-pay | Admitting: Unknown Physician Specialty

## 2020-10-09 MED ORDER — CARVEDILOL 6.25 MG PO TABS: 6.2500 mg | ORAL_TABLET | Freq: Two times a day (BID) | ORAL | 3 refills | Status: DC

## 2020-10-09 MED ORDER — CARVEDILOL 6.25 MG PO TABS: 6.2500 mg | ORAL_TABLET | Freq: Two times a day (BID) | ORAL | 6 refills | Status: DC

## 2020-10-11 ENCOUNTER — Other Ambulatory Visit (HOSPITAL_COMMUNITY): Payer: Self-pay | Admitting: *Deleted

## 2020-10-11 DIAGNOSIS — I5022 Chronic systolic (congestive) heart failure: Secondary | ICD-10-CM

## 2020-10-11 MED ORDER — CARVEDILOL 6.25 MG PO TABS: 6.2500 mg | ORAL_TABLET | Freq: Two times a day (BID) | ORAL | 3 refills | Status: DC

## 2020-11-01 ENCOUNTER — Ambulatory Visit: Payer: Medicare Other | Admitting: *Deleted

## 2020-11-01 ENCOUNTER — Other Ambulatory Visit: Payer: Self-pay

## 2020-11-01 DIAGNOSIS — Z5181 Encounter for therapeutic drug level monitoring: Secondary | ICD-10-CM

## 2020-11-01 LAB — POCT INR: INR: 3.6 — AB (ref 2.0–3.0)

## 2020-11-01 NOTE — Patient Instructions (Signed)
Description   Do not take any Warfarin today then start taking Warfarin 1 tablet daily except 1/2 tablet on Sundays. Recheck in 3 weeks. Stay consistent with your greens. Coumadin Clinic [phone]

## 2020-11-27 ENCOUNTER — Other Ambulatory Visit: Payer: Self-pay

## 2020-11-27 ENCOUNTER — Ambulatory Visit: Payer: Medicare Other | Admitting: *Deleted

## 2020-11-27 DIAGNOSIS — Z5181 Encounter for therapeutic drug level monitoring: Secondary | ICD-10-CM

## 2020-11-27 LAB — POCT INR: INR: 4.1 — AB (ref 2.0–3.0)

## 2020-11-27 NOTE — Patient Instructions (Signed)
Description   Do not take any Warfarin today then start taking Warfarin 1 tablet daily except 1/2 tablet on Sundays and Thursdays. Recheck in 2 weeks. Stay consistent with your greens. Coumadin Clinic [phone]

## 2020-12-14 ENCOUNTER — Other Ambulatory Visit: Payer: Self-pay

## 2020-12-14 ENCOUNTER — Ambulatory Visit: Payer: Medicare Other

## 2020-12-14 ENCOUNTER — Other Ambulatory Visit (HOSPITAL_COMMUNITY): Payer: Self-pay

## 2020-12-14 DIAGNOSIS — Z5181 Encounter for therapeutic drug level monitoring: Secondary | ICD-10-CM

## 2020-12-14 LAB — POCT INR: INR: 2.5 (ref 2.0–3.0)

## 2020-12-14 MED ORDER — ENTRESTO 97-103 MG PO TABS: 1.0000 | ORAL_TABLET | Freq: Two times a day (BID) | ORAL | 2 refills | Status: DC

## 2020-12-14 NOTE — Patient Instructions (Signed)
Description   Continue warfarin 1 tablet daily except 1/2 tablet on Sundays and Thursdays. Recheck in 4 weeks. Stay consistent with your greens. Coumadin Clinic [phone]

## 2020-12-26 ENCOUNTER — Other Ambulatory Visit: Payer: Self-pay | Admitting: Internal Medicine

## 2020-12-26 NOTE — Telephone Encounter (Signed)
Prescription refill request received for warfarin  Lov: Bensimhon, 02/24/2020 Next INR check: 9/22 Warfarin tablet strength: 5 mg   Refill sent.

## 2021-01-12 ENCOUNTER — Ambulatory Visit (INDEPENDENT_AMBULATORY_CARE_PROVIDER_SITE_OTHER): Payer: Medicare Other

## 2021-01-12 ENCOUNTER — Other Ambulatory Visit: Payer: Self-pay

## 2021-01-12 DIAGNOSIS — Z5181 Encounter for therapeutic drug level monitoring: Secondary | ICD-10-CM

## 2021-01-12 LAB — POCT INR: INR: 2.8 (ref 2.0–3.0)

## 2021-01-12 NOTE — Patient Instructions (Signed)
Description   °Continue warfarin 1 tablet daily except 1/2 tablet on Sundays and Thursdays. Recheck in 6 weeks. Stay consistent with your greens. Coumadin Clinic [phone] °  °   °

## 2021-01-16 ENCOUNTER — Other Ambulatory Visit: Payer: Self-pay

## 2021-01-16 MED ORDER — ENTRESTO 97-103 MG PO TABS: 1.0000 | ORAL_TABLET | Freq: Two times a day (BID) | ORAL | 2 refills | Status: DC

## 2021-02-23 ENCOUNTER — Ambulatory Visit (INDEPENDENT_AMBULATORY_CARE_PROVIDER_SITE_OTHER): Payer: Medicare Other

## 2021-02-23 ENCOUNTER — Other Ambulatory Visit: Payer: Self-pay

## 2021-02-23 DIAGNOSIS — Z5181 Encounter for therapeutic drug level monitoring: Secondary | ICD-10-CM | POA: Diagnosis not present

## 2021-02-23 LAB — POCT INR: INR: 2.3 (ref 2.0–3.0)

## 2021-02-23 NOTE — Patient Instructions (Signed)
Description   °Continue warfarin 1 tablet daily except 1/2 tablet on Sundays and Thursdays. Recheck in 6 weeks. Stay consistent with your greens. Coumadin Clinic [phone] °  °   °

## 2021-03-14 ENCOUNTER — Encounter (HOSPITAL_COMMUNITY): Payer: Self-pay | Admitting: Internal Medicine

## 2021-03-14 ENCOUNTER — Ambulatory Visit (HOSPITAL_COMMUNITY)
Admission: RE | Admit: 2021-03-14 | Discharge: 2021-03-14 | Disposition: A | Discharge status: Home / Self Care | Payer: Medicare Other | Source: Ambulatory Visit | Attending: Internal Medicine | Admitting: Internal Medicine

## 2021-03-14 VITALS — BP 130/80 | HR 65 | Wt 215.0 lb

## 2021-03-14 DIAGNOSIS — E785 Hyperlipidemia, unspecified: Secondary | ICD-10-CM | POA: Diagnosis not present

## 2021-03-14 DIAGNOSIS — I252 Old myocardial infarction: Secondary | ICD-10-CM | POA: Diagnosis not present

## 2021-03-14 DIAGNOSIS — I2510 Atherosclerotic heart disease of native coronary artery without angina pectoris: Secondary | ICD-10-CM

## 2021-03-14 DIAGNOSIS — I236 Thrombosis of atrium, auricular appendage, and ventricle as current complications following acute myocardial infarction: Secondary | ICD-10-CM | POA: Diagnosis not present

## 2021-03-14 DIAGNOSIS — Z8673 Personal history of transient ischemic attack (TIA), and cerebral infarction without residual deficits: Secondary | ICD-10-CM | POA: Diagnosis not present

## 2021-03-14 DIAGNOSIS — Z7984 Long term (current) use of oral hypoglycemic drugs: Secondary | ICD-10-CM | POA: Insufficient documentation

## 2021-03-14 DIAGNOSIS — Z955 Presence of coronary angioplasty implant and graft: Secondary | ICD-10-CM | POA: Diagnosis not present

## 2021-03-14 DIAGNOSIS — Z7901 Long term (current) use of anticoagulants: Secondary | ICD-10-CM | POA: Insufficient documentation

## 2021-03-14 DIAGNOSIS — I5022 Chronic systolic (congestive) heart failure: Secondary | ICD-10-CM

## 2021-03-14 DIAGNOSIS — E119 Type 2 diabetes mellitus without complications: Secondary | ICD-10-CM | POA: Diagnosis not present

## 2021-03-14 DIAGNOSIS — Z79899 Other long term (current) drug therapy: Secondary | ICD-10-CM | POA: Diagnosis not present

## 2021-03-14 DIAGNOSIS — I110 Hypertensive heart disease with heart failure: Secondary | ICD-10-CM | POA: Insufficient documentation

## 2021-03-14 DIAGNOSIS — I240 Acute coronary thrombosis not resulting in myocardial infarction: Secondary | ICD-10-CM

## 2021-03-14 LAB — CBC
HCT: 47 % (ref 39.0–52.0)
Hemoglobin: 15.8 g/dL (ref 13.0–17.0)
MCH: 31.5 pg (ref 26.0–34.0)
MCHC: 33.6 g/dL (ref 30.0–36.0)
MCV: 93.6 fL (ref 80.0–100.0)
Platelets: 205 10*3/uL (ref 150–400)
RBC: 5.02 MIL/uL (ref 4.22–5.81)
RDW: 12.9 % (ref 11.5–15.5)
WBC: 7.7 10*3/uL (ref 4.0–10.5)
nRBC: 0 % (ref 0.0–0.2)

## 2021-03-14 LAB — COMPREHENSIVE METABOLIC PANEL
ALT: 16 U/L (ref 0–44)
AST: 17 U/L (ref 15–41)
Albumin: 3.8 g/dL (ref 3.5–5.0)
Alkaline Phosphatase: 36 U/L — ABNORMAL LOW (ref 38–126)
Anion gap: 7 (ref 5–15)
BUN: 15 mg/dL (ref 8–23)
CO2: 23 mmol/L (ref 22–32)
Calcium: 8.8 mg/dL — ABNORMAL LOW (ref 8.9–10.3)
Chloride: 104 mmol/L (ref 98–111)
Creatinine, Ser: 1.1 mg/dL (ref 0.61–1.24)
GFR, Estimated: >60 mL/min (ref >60)
Glucose, Bld: 118 mg/dL — ABNORMAL HIGH (ref 70–99)
Potassium: 4.3 mmol/L (ref 3.5–5.1)
Sodium: 134 mmol/L — ABNORMAL LOW (ref 135–145)
Total Bilirubin: 1 mg/dL (ref 0.3–1.2)
Total Protein: 6.2 g/dL — ABNORMAL LOW (ref 6.5–8.1)

## 2021-03-14 LAB — BRAIN NATRIURETIC PEPTIDE: B Natriuretic Peptide: 64.9 pg/mL (ref 0.0–100.0)

## 2021-03-14 MED ORDER — ENTRESTO 49-51 MG PO TABS: 1.0000 | ORAL_TABLET | Freq: Two times a day (BID) | ORAL | 3 refills | Status: DC

## 2021-03-14 NOTE — Progress Notes (Signed)
Advanced Heart Failure Clinic Note   Patient ID: Marcus Ramos, male   DOB: February 07, 1941, 80 y.o.   MRN: [id number]  PCP: Dr. Windle Guard Primary Cardiologist: Dr. Gala Romney  HPI: Marcus Ramos is a 80 year old with DM2, HTN, CAD and chronic systolic HF. He suffered a large anterolateral MI in 6/10 c/b cardiogenic shock. He was treated with emergent bare-metal stenting of the LAD, as well as a  drug-eluting stent to the circumflex. S/P PTCA RCA. Ejection fraction was 25% with global hypokinesis, and question of a left ventricular thrombus. He was placed on Coumadin as well as aspirin and Plavix. 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  Had echo 8/10 which confirmed EF 25-30%. MRI 2010 EF 23%. With full thickness scar. No signifcant viability.  We previously arranged for him to see Dr. Johney Frame to discuss ICD placement. He saw Dr. Johney Frame and was set-up for an ICD but he cancelled his implant appt saying he didn't want a device at that time.  ECHO 05/15/16 EF 20-25% apical AK RV ok.   He returns for f/u. Says he is doing ok. Gets tired at end of day. Low energy. No CP.Marland Kitchen Says 2-3 per week his SBP with drop into 80-90s and feels wiped out. He will skip Entresto as needed. BP seems to be lower when he is active. Continues to deer hunt. But has cut back because he struggles with the walking. Struggles with LE neuropathy  Review of systems complete and found to be negative unless listed in HPI.    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 Staged drug eluting stenting of circumflex artery   Type II or unspecified type diabetes mellitus without mention of  complication, not stated as uncontrolled     Current Outpatient Medications  Medication Sig Dispense Refill   allopurinol (ZYLOPRIM) 100 MG tablet Take 1 Tablet Daily 90 tablet 4   atorvastatin (LIPITOR) 40 MG tablet Take 1 tablet (40 mg total) by mouth daily. 90 tablet 4   carvedilol (COREG) 6.25 MG tablet Take 1 tablet (6.25 mg total) by mouth 2 (two) times daily. 90 tablet 3   colchicine 0.6 MG tablet Take 0.6 mg by mouth as needed.       fish oil-omega-3 fatty acids 1000 MG capsule Take 2 g by mouth daily. 2 capsules daily.      JARDIANCE 10 MG TABS tablet TAKE 1 TABLET BY MOUTH ONCE DAILY BEFORE BREAKFAST 30 tablet 11   nitroGLYCERIN (NITROSTAT) 0.4 MG SL tablet Place 0.4 mg under the tongue every 5 (five) minutes as needed.       sacubitril-valsartan (ENTRESTO) 97-103 MG Take 1 tablet by mouth 2 (two) times daily. For further refills please contact Heart Failure clinic to make follow up appointment with Dr Gala Romney. [phone]. 90 tablet 2   spironolactone (ALDACTONE) 25 MG tablet TAKE 1 TABLET DAILY 90 tablet 3   warfarin (JANTOVEN) 5 MG tablet TAKE AS DIRECTED BY THE    COUMADIN CLINIC 100 tablet 0   No current facility-administered medications for this encounter.    Vitals:   03/14/21 1141  BP: 130/80  Pulse: 65  SpO2: 97%  Weight: 97.5 kg (215 lb)    Wt Readings from Last 3 Encounters:  03/14/21 97.5 kg (215 lb)  02/24/20 96.8 kg (213 lb 6.4 oz)  12/24/18 98.2 kg (216 lb 9.6 oz)    PHYSICAL EXAM: General:  Well appearing. No resp difficulty HEENT: normal Neck: supple. no JVD. Carotids 2+ bilat; no bruits. No lymphadenopathy or thryomegaly appreciated. Cor: PMI nondisplaced. Regular rate & rhythm. No rubs, gallops or murmurs. Lungs: clear Abdomen: soft, nontender, nondistended. No hepatosplenomegaly. No bruits or masses. Good bowel sounds. Extremities: no cyanosis, clubbing, rash, edema Neuro: alert & orientedx3, cranial nerves grossly intact. moves all 4  extremities w/o difficulty. Affect pleasant  ECG: NSR 71 with anteroseptal Qs. (Chronic) Personally reviewed  ASSESSMENT & PLAN:  1. Chronic Systolic Heart Failure: ICM, EF 30-35% (11/2012) EF (1/18)  - Echo 1/18 20-25% RV ok  - Echo 11/21 EF 20-25% RV ok - Stable NYHA II-III - Volume status looks good.  - Continue lasix as needed.  - Continue carvedilol 6.25 mg BID.  (tried 9.375 but failed due to dizziness) - BP is low frequently so will cut Entresto to 49/51 bid - Continue Spironolactone 25 mg daily.  - Continue Jardiance 10  - He has seen EP in past and he doesn't want ICD - Have discussed Barostim with him. He is not interested at this time 2. Hyperlipidemia - Per PCP. Continue Lipitor 40 mg daily. 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 Continue coumadin which is managed by Coumadin Clinic at [facility].  - Denies bleeding  4. CAD - s/p large anterior MI in 2010 -No s/s ischemia  - s/p multiple stents.  - Continue statin. LDL < 70 Followed by Dr. Shelah Lewandowsky. - Off ASA with coumadin. 5. DM2 - Continue Jardiance - Recent hgba1c 6.4  Arvilla Meres, MD  12:22 PM

## 2021-03-14 NOTE — Addendum Note (Signed)
Encounter addended by: Noralee Space, RN on: 03/14/2021 12:49 PM  Actions taken: Diagnosis association updated, Pharmacy for encounter modified, Order list changed, Clinical Note Signed, Charge Capture section accepted

## 2021-03-14 NOTE — Patient Instructions (Signed)
Decrease Entresto to 49/51 mg Twice daily   Labs done today, your results will be available in MyChart, we will contact you for abnormal readings.  Your physician recommends that you schedule a follow-up appointment in: 6 months (May 2023), **PLEASE CALL OUR OFFICE IN MARCH TO SCHEDULE THIS APPOINTMENT  If you have any questions or concerns before your next appointment please send [place] a message through [place] or call our office at [phone].    TO LEAVE A MESSAGE FOR THE NURSE SELECT OPTION 2, PLEASE LEAVE A MESSAGE INCLUDING: YOUR NAME DATE OF BIRTH CALL BACK NUMBER REASON FOR CALL**this is important as we prioritize the call backs  YOU WILL RECEIVE A CALL BACK THE SAME DAY AS LONG AS YOU CALL BEFORE 4:00 PM  At the [facility], you and your health needs are our priority. As part of our continuing mission to provide you with exceptional heart care, we have created designated Provider Care Teams. These Care Teams include your primary Cardiologist (physician) and Advanced Practice Providers (APPs- Physician Assistants and Nurse Practitioners) who all work together to provide you with the care you need, when you need it.   You may see any of the following providers on your designated Care Team at your next follow up: Dr Arvilla Meres Dr Carron Curie, NP Robbie Lis, [place] [facility] [place], [place] Karle Plumber, PharmD   Please be sure to bring in all your medications bottles to every appointment.

## 2021-03-25 ENCOUNTER — Other Ambulatory Visit (HOSPITAL_COMMUNITY): Payer: Self-pay | Admitting: Internal Medicine

## 2021-03-25 DIAGNOSIS — I5022 Chronic systolic (congestive) heart failure: Secondary | ICD-10-CM

## 2021-04-06 ENCOUNTER — Other Ambulatory Visit: Payer: Self-pay

## 2021-04-06 ENCOUNTER — Ambulatory Visit: Payer: Medicare Other

## 2021-04-06 DIAGNOSIS — Z5181 Encounter for therapeutic drug level monitoring: Secondary | ICD-10-CM | POA: Diagnosis not present

## 2021-04-06 LAB — POCT INR: INR: 2.4 (ref 2.0–3.0)

## 2021-04-06 NOTE — Patient Instructions (Signed)
Description   Continue warfarin 1 tablet daily except 1/2 tablet on Sundays and Thursdays. Recheck in 6 weeks. Stay consistent with your greens. Coumadin Clinic [phone]

## 2021-04-26 ENCOUNTER — Other Ambulatory Visit (HOSPITAL_COMMUNITY): Payer: Self-pay

## 2021-04-26 MED ORDER — ENTRESTO 49-51 MG PO TABS: 1.0000 | ORAL_TABLET | Freq: Two times a day (BID) | ORAL | 5 refills | Status: DC

## 2021-05-18 ENCOUNTER — Other Ambulatory Visit: Payer: Self-pay

## 2021-05-18 ENCOUNTER — Ambulatory Visit: Payer: Medicare Other | Admitting: *Deleted

## 2021-05-18 DIAGNOSIS — Z5181 Encounter for therapeutic drug level monitoring: Secondary | ICD-10-CM | POA: Diagnosis not present

## 2021-05-18 LAB — POCT INR: INR: 3.1 — AB (ref 2.0–3.0)

## 2021-05-18 NOTE — Patient Instructions (Signed)
Description   Continue warfarin 1 tablet daily except 1/2 tablet on Sundays and Thursdays. Recheck in 6 weeks. Eat an extra serving of greens tonight.  Stay consistent with your greens. Coumadin Clinic [phone]

## 2021-06-08 ENCOUNTER — Other Ambulatory Visit: Payer: Self-pay | Admitting: Internal Medicine

## 2021-06-10 ENCOUNTER — Other Ambulatory Visit (HOSPITAL_COMMUNITY): Payer: Self-pay | Admitting: Internal Medicine

## 2021-06-29 ENCOUNTER — Other Ambulatory Visit: Payer: Self-pay

## 2021-06-29 ENCOUNTER — Ambulatory Visit: Payer: Medicare Other

## 2021-06-29 DIAGNOSIS — I6350 Cerebral infarction due to unspecified occlusion or stenosis of unspecified cerebral artery: Secondary | ICD-10-CM

## 2021-06-29 DIAGNOSIS — Z5181 Encounter for therapeutic drug level monitoring: Secondary | ICD-10-CM

## 2021-06-29 LAB — POCT INR: INR: 2.3 (ref 2.0–3.0)

## 2021-06-29 NOTE — Patient Instructions (Signed)
Description   ?Continue warfarin 1 tablet daily except 1/2 tablet on Sundays and Thursdays. Recheck in 6 weeks. Eat an extra serving of greens tonight.  Stay consistent with your greens. Coumadin Clinic [phone] ?  ?  ?

## 2021-08-10 ENCOUNTER — Ambulatory Visit: Payer: Medicare Other

## 2021-08-10 DIAGNOSIS — Z5181 Encounter for therapeutic drug level monitoring: Secondary | ICD-10-CM

## 2021-08-10 LAB — POCT INR: INR: 2.3 (ref 2.0–3.0)

## 2021-08-10 NOTE — Patient Instructions (Signed)
Description   °Continue warfarin 1 tablet daily except 1/2 tablet on Sundays and Thursdays. Recheck in 6 weeks. Eat an extra serving of greens tonight.  Stay consistent with your greens. Coumadin Clinic [phone] °  ° ° °

## 2021-09-06 ENCOUNTER — Other Ambulatory Visit (HOSPITAL_COMMUNITY): Payer: Self-pay | Admitting: Internal Medicine

## 2021-09-06 DIAGNOSIS — Z7901 Long term (current) use of anticoagulants: Secondary | ICD-10-CM

## 2021-09-06 NOTE — Telephone Encounter (Signed)
Prescription refill request received for warfarin Lov: 03/14/21 (Bensimhon)  Next INR check: 09/21/21 Warfarin tablet strength: 5mg   Appropriate dose and refill sent to requested pharmacy.

## 2021-09-21 ENCOUNTER — Ambulatory Visit: Payer: Medicare Other

## 2021-09-21 DIAGNOSIS — Z5181 Encounter for therapeutic drug level monitoring: Secondary | ICD-10-CM

## 2021-09-21 LAB — POCT INR: INR: 2.3 (ref 2.0–3.0)

## 2021-09-21 NOTE — Patient Instructions (Signed)
Description   °Continue warfarin 1 tablet daily except 1/2 tablet on Sundays and Thursdays. Recheck in 6 weeks. Stay consistent with your greens. Coumadin Clinic [phone] °  °   °

## 2021-10-10 ENCOUNTER — Encounter (HOSPITAL_COMMUNITY): Payer: Self-pay | Admitting: Internal Medicine

## 2021-10-10 ENCOUNTER — Ambulatory Visit (HOSPITAL_COMMUNITY)
Admission: RE | Admit: 2021-10-10 | Discharge: 2021-10-10 | Disposition: A | Discharge status: Home / Self Care | Payer: Medicare Other | Source: Ambulatory Visit | Attending: Internal Medicine | Admitting: Internal Medicine

## 2021-10-10 VITALS — BP 116/78 | HR 74 | Wt 214.0 lb

## 2021-10-10 DIAGNOSIS — Z7901 Long term (current) use of anticoagulants: Secondary | ICD-10-CM | POA: Diagnosis not present

## 2021-10-10 DIAGNOSIS — I2510 Atherosclerotic heart disease of native coronary artery without angina pectoris: Secondary | ICD-10-CM | POA: Insufficient documentation

## 2021-10-10 DIAGNOSIS — Z7984 Long term (current) use of oral hypoglycemic drugs: Secondary | ICD-10-CM | POA: Diagnosis not present

## 2021-10-10 DIAGNOSIS — I513 Intracardiac thrombosis, not elsewhere classified: Secondary | ICD-10-CM | POA: Diagnosis not present

## 2021-10-10 DIAGNOSIS — I110 Hypertensive heart disease with heart failure: Secondary | ICD-10-CM | POA: Insufficient documentation

## 2021-10-10 DIAGNOSIS — I252 Old myocardial infarction: Secondary | ICD-10-CM | POA: Insufficient documentation

## 2021-10-10 DIAGNOSIS — I5022 Chronic systolic (congestive) heart failure: Secondary | ICD-10-CM | POA: Insufficient documentation

## 2021-10-10 DIAGNOSIS — Z79899 Other long term (current) drug therapy: Secondary | ICD-10-CM | POA: Insufficient documentation

## 2021-10-10 DIAGNOSIS — Z955 Presence of coronary angioplasty implant and graft: Secondary | ICD-10-CM | POA: Insufficient documentation

## 2021-10-10 DIAGNOSIS — E119 Type 2 diabetes mellitus without complications: Secondary | ICD-10-CM | POA: Insufficient documentation

## 2021-10-10 DIAGNOSIS — I240 Acute coronary thrombosis not resulting in myocardial infarction: Secondary | ICD-10-CM

## 2021-10-10 DIAGNOSIS — Z8673 Personal history of transient ischemic attack (TIA), and cerebral infarction without residual deficits: Secondary | ICD-10-CM | POA: Insufficient documentation

## 2021-10-10 DIAGNOSIS — E785 Hyperlipidemia, unspecified: Secondary | ICD-10-CM | POA: Diagnosis not present

## 2021-10-10 NOTE — Addendum Note (Signed)
Encounter addended by: Noralee Space, RN on: 10/10/2021 11:40 AM  Actions taken: Clinical Note Signed

## 2021-10-10 NOTE — Patient Instructions (Signed)
Your physician recommends that you schedule a follow-up appointment in: 9 months with an echocardiogram (March 2024), **PLEASE CALL OUR OFFICE IN [place] TO SCHEDULE THESE APPOINTMENTS  If you have any questions or concerns before your next appointment please send [place] a message through [place] or call our office at [phone].    TO LEAVE A MESSAGE FOR THE NURSE SELECT OPTION 2, PLEASE LEAVE A MESSAGE INCLUDING: YOUR NAME DATE OF BIRTH CALL BACK NUMBER REASON FOR CALL**this is important as we prioritize the call backs  YOU WILL RECEIVE A CALL BACK THE SAME DAY AS LONG AS YOU CALL BEFORE 4:00 PM  At the [facility], you and your health needs are our priority. As part of our continuing mission to provide you with exceptional heart care, we have created designated Provider Care Teams. These Care Teams include your primary Cardiologist (physician) and Advanced Practice Providers (APPs- Physician Assistants and Nurse Practitioners) who all work together to provide you with the care you need, when you need it.   You may see any of the following providers on your designated Care Team at your next follow up: Dr Arvilla Meres Dr Carron Curie, NP Robbie Lis, [place] [facility] [place], [place] Karle Plumber, PharmD   Please be sure to bring in all your medications bottles to every appointment.

## 2021-10-10 NOTE — Addendum Note (Signed)
Encounter addended by: Noralee Space, RN on: 10/10/2021 11:43 AM  Actions taken: Order list changed, Diagnosis association updated

## 2021-10-10 NOTE — Progress Notes (Signed)
Advanced Heart Failure Clinic Note   Patient ID: Marcus Ramos, male   DOB: 27-Feb-1941, 81 y.o.   MRN: [id number]  PCP: Dr. Windle Guard Primary Cardiologist: Dr. Gala Romney  HPI: Marcus Ramos is a 81 year old with DM2, HTN, CAD and chronic systolic HF. He suffered a large anterolateral MI in 6/10 c/b cardiogenic shock. He was treated with emergent bare-metal stenting of the LAD, as well as a  drug-eluting stent to the circumflex. S/P PTCA RCA. Ejection fraction was 25% with global hypokinesis, and question of a left ventricular thrombus. He was placed on Coumadin as well as aspirin and Plavix. 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  Had echo 8/10 which confirmed EF 25-30%. MRI 2010 EF 23%. With full thickness scar. No signifcant viability.  We previously arranged for him to see Dr. Johney Frame to discuss ICD placement. He saw Dr. Johney Frame and was set-up for an ICD but he cancelled his implant appt saying he didn't want a device at that time.  ECHO 05/15/16 EF 20-25% apical AK RV ok.   He returns for f/u. At last visit BP was low and we cut Entresto back.Feels much better no low BP. No CP. Mild DOE which is unchanged. Does get occasional dizziness since his surgery but no presyncope. Working in the garden and hunting.  No problem with meds. Coumadin dose managed by coumadin clinic   Review of systems complete and found to be negative unless listed in HPI.    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 Staged drug eluting stenting of circumflex artery   Type II or unspecified type diabetes mellitus without mention of complication, not stated as  uncontrolled     Current Outpatient Medications  Medication Sig Dispense Refill   Acetaminophen (TYLENOL 8 HOUR ARTHRITIS PAIN PO) Take 800 mg by mouth daily.     allopurinol (ZYLOPRIM) 100 MG tablet TAKE 1 TABLET DAILY 90 tablet 3   atorvastatin (LIPITOR) 40 MG tablet TAKE 1 TABLET DAILY 90 tablet 3   carvedilol (COREG) 6.25 MG tablet TAKE 1 TABLET TWICE A DAY 180 tablet 3   colchicine 0.6 MG tablet Take 0.6 mg by mouth as needed.       fish oil-omega-3 fatty acids 1000 MG capsule Take 2 g by mouth daily. 2 capsules daily.      gabapentin (NEURONTIN) 800 MG tablet SMARTSIG:0.5 Tablet(s) By Mouth Every 12 Hours     JARDIANCE 10 MG TABS tablet TAKE 1 TABLET BY MOUTH ONCE DAILY BEFORE BREAKFAST 30 tablet 11   nitroGLYCERIN (NITROSTAT) 0.4 MG SL tablet Place 0.4 mg under the tongue every 5 (five) minutes as needed.       sacubitril-valsartan (ENTRESTO) 49-51 MG Take 1 tablet by mouth 2 (two) times daily. 60 tablet 5   spironolactone (ALDACTONE) 25 MG tablet TAKE 1 TABLET DAILY 90 tablet 3   warfarin (JANTOVEN) 5 MG tablet Take 1 tablet daily except 1/2 tablet on Sundays and Thursdays or as directed by Coumadin Clinic 100 tablet 1   No current facility-administered medications for this encounter.    Vitals:   10/10/21 1108  BP: 116/78  Pulse: 74  SpO2: 95%  Weight: 97.1 kg (214 lb)    Wt Readings from Last 3 Encounters:  10/10/21 97.1 kg (214 lb)  03/14/21 97.5 kg (215 lb)  02/24/20 96.8 kg (213 lb 6.4 oz)    PHYSICAL EXAM: General:  Well appearing. No resp difficulty HEENT: normal Neck: supple. no JVD. Carotids 2+ bilat; no bruits. No lymphadenopathy or thryomegaly appreciated. Cor: PMI nondisplaced. Regular rate & rhythm. No rubs, gallops or murmurs. Lungs: clear Abdomen: soft, nontender, nondistended. No hepatosplenomegaly. No bruits or masses. Good bowel sounds. Extremities: no cyanosis, clubbing, rash, edema Neuro: alert & orientedx3, cranial nerves grossly intact. moves all  4 extremities w/o difficulty. Affect pleasant   ASSESSMENT & PLAN:  1. Chronic Systolic Heart Failure: ICM, EF 30-35% (11/2012) EF (1/18)  - Echo 1/18 20-25% RV ok  - Echo 11/21 EF 20-25% RV ok - Stable NYHA II-III - Volume status looks good - Continue lasix as needed - Continue carvedilol 6.25 mg BID.  (tried 9.375 but failed due to dizziness) - Continue Entresto to 49/51 bid (dose cut back due to low BP) - Continue Spironolactone 25 mg daily.  - Continue Jardiance 10  - He has seen EP in past and he doesn't want ICD - Have discussed Barostim with him. He is not interested at this time - Recent labs with Dr. Shelah Lewandowsky ok  - RTC with echo in 9 months 2. Hyperlipidemia - Per PCP. Continue Lipitor 40 mg daily. 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 Continue coumadin which is managed by Coumadin Clinic at [facility].  - Denies bleeding  4. CAD - s/p large anterior MI in 2010 - no s/s ischemia - s/p multiple stents.  - Continue statin. LDL < 70 Followed by Dr. Shelah Lewandowsky - Off ASA with coumadin. 5. DM2 - Continue Jardiance - Recent hgba1c 6.5  Arvilla Meres, MD  11:30 AM

## 2021-11-02 ENCOUNTER — Ambulatory Visit (INDEPENDENT_AMBULATORY_CARE_PROVIDER_SITE_OTHER): Payer: Medicare Other

## 2021-11-02 DIAGNOSIS — Z5181 Encounter for therapeutic drug level monitoring: Secondary | ICD-10-CM

## 2021-11-02 LAB — POCT INR: INR: 2.7 (ref 2.0–3.0)

## 2021-11-02 NOTE — Patient Instructions (Signed)
Description   Take 1.5 tablets today and then continue warfarin 1 tablet daily except 1/2 tablet on Sundays and Thursdays. Recheck in 6 weeks. Stay consistent with your greens.  Coumadin Clinic [phone]

## 2021-12-10 ENCOUNTER — Other Ambulatory Visit (HOSPITAL_COMMUNITY): Payer: Self-pay | Admitting: Internal Medicine

## 2021-12-14 ENCOUNTER — Ambulatory Visit (INDEPENDENT_AMBULATORY_CARE_PROVIDER_SITE_OTHER): Payer: Medicare Other | Admitting: *Deleted

## 2021-12-14 DIAGNOSIS — Z5181 Encounter for therapeutic drug level monitoring: Secondary | ICD-10-CM

## 2021-12-14 LAB — POCT INR: INR: 2.9 (ref 2.0–3.0)

## 2021-12-14 NOTE — Patient Instructions (Signed)
Description   Continue taking warfarin 1 tablet daily except 1/2 tablet on Sundays and Thursdays. Recheck in 6 weeks. Stay consistent with your greens.  Coumadin Clinic [phone] or [phone]     

## 2022-01-25 ENCOUNTER — Ambulatory Visit: Payer: Medicare Other | Attending: Cardiovascular Disease

## 2022-01-25 DIAGNOSIS — Z5181 Encounter for therapeutic drug level monitoring: Secondary | ICD-10-CM

## 2022-01-25 LAB — POCT INR: INR: 2.5 (ref 2.0–3.0)

## 2022-01-25 NOTE — Patient Instructions (Signed)
Continue taking warfarin 1 tablet daily except 1/2 tablet on Sundays and Thursdays. Recheck in 6 weeks. Stay consistent with your greens.  Coumadin Clinic [phone] or [phone]

## 2022-03-08 ENCOUNTER — Ambulatory Visit: Payer: Medicare Other | Attending: Cardiovascular Disease

## 2022-03-08 DIAGNOSIS — Z5181 Encounter for therapeutic drug level monitoring: Secondary | ICD-10-CM | POA: Diagnosis not present

## 2022-03-08 LAB — POCT INR: INR: 3 (ref 2.0–3.0)

## 2022-03-08 NOTE — Patient Instructions (Signed)
Continue taking warfarin 1 tablet daily except 1/2 tablet on Sundays and Thursdays. Recheck in 7 weeks. Stay consistent with your greens.  Coumadin Clinic [phone] or [phone]

## 2022-03-14 ENCOUNTER — Other Ambulatory Visit (HOSPITAL_COMMUNITY): Payer: Self-pay | Admitting: Internal Medicine

## 2022-03-14 DIAGNOSIS — I5022 Chronic systolic (congestive) heart failure: Secondary | ICD-10-CM

## 2022-03-14 DIAGNOSIS — Z7901 Long term (current) use of anticoagulants: Secondary | ICD-10-CM

## 2022-03-18 NOTE — Telephone Encounter (Signed)
Warfarin refill Last OV 10/10/2021 Last INR 03/08/2022

## 2022-04-26 ENCOUNTER — Ambulatory Visit: Payer: Medicare Other | Attending: Cardiology

## 2022-04-26 DIAGNOSIS — Z5181 Encounter for therapeutic drug level monitoring: Secondary | ICD-10-CM

## 2022-04-26 LAB — POCT INR: INR: 3.8 — AB (ref 2.0–3.0)

## 2022-04-26 NOTE — Patient Instructions (Signed)
HOLD TODAY ONLY THEN Continue taking warfarin 1 tablet daily except 1/2 tablet on Sundays and Thursdays. Recheck in 7 weeks. Stay consistent with your greens.  Coumadin Clinic [phone] or [phone]

## 2022-05-04 ENCOUNTER — Other Ambulatory Visit (HOSPITAL_COMMUNITY): Payer: Self-pay | Admitting: Internal Medicine

## 2022-05-27 ENCOUNTER — Other Ambulatory Visit: Payer: Self-pay | Admitting: Internal Medicine

## 2022-06-01 ENCOUNTER — Other Ambulatory Visit (HOSPITAL_COMMUNITY): Payer: Self-pay | Admitting: Internal Medicine

## 2022-06-14 ENCOUNTER — Ambulatory Visit: Payer: Medicare Other | Attending: Cardiovascular Disease | Admitting: *Deleted

## 2022-06-14 DIAGNOSIS — Z5181 Encounter for therapeutic drug level monitoring: Secondary | ICD-10-CM

## 2022-06-14 LAB — POCT INR: POC INR: 2.8

## 2022-06-14 NOTE — Patient Instructions (Signed)
Description   Continue taking warfarin 1 tablet daily except 1/2 tablet on Sundays and Thursdays. Recheck in 6 weeks. Stay consistent with your greens.  Coumadin Clinic [phone] or [phone]

## 2022-06-30 ENCOUNTER — Other Ambulatory Visit (HOSPITAL_COMMUNITY): Payer: Self-pay | Admitting: Internal Medicine

## 2022-07-26 ENCOUNTER — Ambulatory Visit: Payer: Medicare Other | Attending: Cardiovascular Disease

## 2022-07-26 DIAGNOSIS — Z5181 Encounter for therapeutic drug level monitoring: Secondary | ICD-10-CM | POA: Diagnosis not present

## 2022-07-26 LAB — POCT INR: INR: 3.9 — AB (ref 2.0–3.0)

## 2022-07-26 NOTE — Patient Instructions (Signed)
Description   HOLD today's dose and then continue taking warfarin 1 tablet daily except 1/2 tablet on Sundays and Thursdays.  Recheck in 5 weeks.  Stay consistent with your greens.  Coumadin Clinic [phone]

## 2022-07-28 ENCOUNTER — Other Ambulatory Visit (HOSPITAL_COMMUNITY): Payer: Self-pay | Admitting: Internal Medicine

## 2022-08-07 ENCOUNTER — Encounter (HOSPITAL_COMMUNITY): Payer: Self-pay | Admitting: Internal Medicine

## 2022-08-07 ENCOUNTER — Ambulatory Visit (HOSPITAL_BASED_OUTPATIENT_CLINIC_OR_DEPARTMENT_OTHER)
Admission: RE | Admit: 2022-08-07 | Discharge: 2022-08-07 | Disposition: A | Discharge status: Home / Self Care | Payer: Medicare Other | Source: Ambulatory Visit | Attending: Internal Medicine | Admitting: Internal Medicine

## 2022-08-07 ENCOUNTER — Ambulatory Visit (HOSPITAL_COMMUNITY)
Admission: RE | Admit: 2022-08-07 | Discharge: 2022-08-07 | Disposition: A | Discharge status: Home / Self Care | Payer: Medicare Other | Source: Ambulatory Visit | Attending: Obstetrics and Gynecology | Admitting: Obstetrics and Gynecology

## 2022-08-07 VITALS — BP 104/60 | HR 55 | Ht 71.0 in | Wt 207.8 lb

## 2022-08-07 DIAGNOSIS — E119 Type 2 diabetes mellitus without complications: Secondary | ICD-10-CM | POA: Diagnosis not present

## 2022-08-07 DIAGNOSIS — I2510 Atherosclerotic heart disease of native coronary artery without angina pectoris: Secondary | ICD-10-CM | POA: Insufficient documentation

## 2022-08-07 DIAGNOSIS — Z7901 Long term (current) use of anticoagulants: Secondary | ICD-10-CM | POA: Diagnosis not present

## 2022-08-07 DIAGNOSIS — I5022 Chronic systolic (congestive) heart failure: Secondary | ICD-10-CM | POA: Diagnosis not present

## 2022-08-07 DIAGNOSIS — I110 Hypertensive heart disease with heart failure: Secondary | ICD-10-CM | POA: Diagnosis not present

## 2022-08-07 DIAGNOSIS — E785 Hyperlipidemia, unspecified: Secondary | ICD-10-CM | POA: Insufficient documentation

## 2022-08-07 DIAGNOSIS — I252 Old myocardial infarction: Secondary | ICD-10-CM | POA: Diagnosis not present

## 2022-08-07 DIAGNOSIS — R42 Dizziness and giddiness: Secondary | ICD-10-CM | POA: Insufficient documentation

## 2022-08-07 DIAGNOSIS — L905 Scar conditions and fibrosis of skin: Secondary | ICD-10-CM | POA: Diagnosis not present

## 2022-08-07 DIAGNOSIS — I219 Acute myocardial infarction, unspecified: Secondary | ICD-10-CM | POA: Insufficient documentation

## 2022-08-07 DIAGNOSIS — Z79899 Other long term (current) drug therapy: Secondary | ICD-10-CM | POA: Diagnosis not present

## 2022-08-07 DIAGNOSIS — I959 Hypotension, unspecified: Secondary | ICD-10-CM | POA: Insufficient documentation

## 2022-08-07 DIAGNOSIS — I240 Acute coronary thrombosis not resulting in myocardial infarction: Secondary | ICD-10-CM

## 2022-08-07 LAB — ECHOCARDIOGRAM COMPLETE
Area-P 1/2: 2.88 cm2
Calc EF: 23.3 %
Single Plane A2C EF: 26.2 %
Single Plane A4C EF: 20 %

## 2022-08-07 MED ORDER — PERFLUTREN LIPID MICROSPHERE
1.0000 mL | INTRAVENOUS | Status: AC | PRN
  Administered 2022-08-07: 6 mL via INTRAVENOUS

## 2022-08-07 MED ORDER — NITROGLYCERIN 0.4 MG SL SUBL: 0.4000 mg | SUBLINGUAL_TABLET | SUBLINGUAL | 3 refills | Status: AC | PRN

## 2022-08-07 NOTE — Progress Notes (Signed)
ReDS Vest / Clip - 08/07/22 0904       ReDS Vest / Clip   Station Marker C    Ruler Value 31    ReDS Value Range Low volume    ReDS Actual Value 31

## 2022-08-07 NOTE — Addendum Note (Signed)
Encounter addended by: Dolores Patty, MD on: 08/07/2022 10:16 PM  Actions taken: Clinical Note Signed

## 2022-08-07 NOTE — Patient Instructions (Signed)
There has been no changes to your medications.  Your physician recommends that you schedule a follow-up appointment in: 9 months ( January 2025) ** please call the office in November to arrange your follow up appointment. **  If you have any questions or concerns before your next appointment please send us a message through mychart or call our office at [phone].    TO LEAVE A MESSAGE FOR THE NURSE SELECT OPTION 2, PLEASE LEAVE A MESSAGE INCLUDING: YOUR NAME DATE OF BIRTH CALL BACK NUMBER REASON FOR CALL**this is important as we prioritize the call backs  YOU WILL RECEIVE A CALL BACK THE SAME DAY AS LONG AS YOU CALL BEFORE 4:00 PM  At the [facility] Clinic, you and your health needs are our priority. As part of our continuing mission to provide you with exceptional heart care, we have created designated Provider Care Teams. These Care Teams include your primary Cardiologist (physician) and Advanced Practice Providers (APPs- Physician Assistants and Nurse Practitioners) who all work together to provide you with the care you need, when you need it.   You may see any of the following providers on your designated Care Team at your next follow up: Dr Daniel Bensimhon Dr Dalton McLean Dr. Aditya Sabharwal Amy Clegg, NP Brittainy Simmons, PA Jessica Milford,NP Lindsay Finch, PA Alma Diaz, NP Lauren Kemp, PharmD   Please be sure to bring in all your medications bottles to every appointment.    Thank you for choosing [facility]    

## 2022-08-07 NOTE — Progress Notes (Addendum)
Advanced Heart Failure Clinic Note   Patient ID: Marcus Ramos, male   DOB: May 13, 1940, 82 y.o.   MRN: [id number]  PCP: Dr. Windle Guard Primary Cardiologist: Dr. Gala Romney  HPI:  Marcus Ramos is a 82 year old with DM2, HTN, CAD and chronic systolic HF. He suffered a large anterolateral MI in 6/10 c/b cardiogenic shock. He was treated with emergent bare-metal stenting of the LAD, as well as a  drug-eluting stent to the circumflex. S/P PTCA RCA. Ejection fraction was 25% with global hypokinesis, and question of a left ventricular thrombus. He was placed on Coumadin as well as aspirin and Plavix. 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  Had echo 8/10 which confirmed EF 25-30%. MRI 2010 EF 23%. With full thickness scar. No signifcant viability.  We previously arranged for him to see Dr. Johney Frame to discuss ICD placement. He saw Dr. Johney Frame and was set-up for an ICD but he cancelled his implant appt saying he didn't want a device at that time.  ECHO 05/15/16 EF 20-25% apical AK RV ok.   He returns for f/u. Previously BP was low a few visits back and we cut Entresto back. Says he feels pretty good. Feels like he is slowing down with age. No CP, SOB, edema, orthopnea or PND. Follows his BP routinely usually 110-130. Occasionally < 100 and will hold Entresto.   Echo today 08/07/22: EF 20-25%    Review of systems complete and found to be negative unless listed in HPI.    Past Medical History:  Diagnosis Date   CHF (congestive heart failure)    Embolic stroke    Essential hypertension, benign    Gout    Hypopotassemia    Obesity, unspecified    Other specified forms of chronic ischemic heart disease    Stented coronary artery    Bare-metal stenting of 100% occluded left descending artery. Staged drug eluting stenting of circumflex artery   Type II or unspecified type diabetes mellitus without mention of complication, not stated as  uncontrolled     Current Outpatient Medications  Medication Sig Dispense Refill   Acetaminophen (TYLENOL 8 HOUR ARTHRITIS PAIN PO) Take 800 mg by mouth daily.     allopurinol (ZYLOPRIM) 100 MG tablet Take 50 mg by mouth daily.     atorvastatin (LIPITOR) 40 MG tablet TAKE 1 TABLET DAILY 90 tablet 3   carvedilol (COREG) 6.25 MG tablet TAKE 1 TABLET TWICE A DAY 180 tablet 3   colchicine 0.6 MG tablet Take 0.6 mg by mouth as needed.       ENTRESTO 49-51 MG Take 1 tablet by mouth twice daily 60 tablet 11   gabapentin (NEURONTIN) 800 MG tablet SMARTSIG:0.5 Tablet(s) By Mouth Every 12 Hours     JARDIANCE 10 MG TABS tablet TAKE 1 TABLET BY MOUTH ONCE DAILY BEFORE BREAKFAST 30 tablet 0   nitroGLYCERIN (NITROSTAT) 0.4 MG SL tablet Place 0.4 mg under the tongue every 5 (five) minutes as needed.       spironolactone (ALDACTONE) 25 MG tablet TAKE 1 TABLET DAILY 90 tablet 3   warfarin (JANTOVEN) 5 MG tablet TAKE 1 TABLET DAILY EXCEPT 1/2 TABLET ON SUNDAYS AND  THURSDAYS OR AS DIRECTED BYCOUMADIN CLINIC 100 tablet 1   No current facility-administered medications for this encounter.   Facility-Administered Medications Ordered in Other Encounters  Medication Dose Route Frequency Provider Last Rate Last Admin   perflutren lipid microspheres (DEFINITY) IV suspension  1-10 mL Intravenous PRN  Ziyad Dyar, Bevelyn Buckles, MD   6 mL at 08/07/22 0906    Vitals:   08/07/22 0904  BP: 104/60  Pulse: (!) 55  SpO2: 97%  Weight: 94.3 kg (207 lb 12.8 oz)     Wt Readings from Last 3 Encounters:  08/07/22 94.3 kg (207 lb 12.8 oz)  10/10/21 97.1 kg (214 lb)  03/14/21 97.5 kg (215 lb)    PHYSICAL EXAM: General:  Elderly No resp difficulty HEENT: normal Neck: supple. no JVD. Carotids 2+ bilat; no bruits. No lymphadenopathy or thryomegaly appreciated. Cor: Regular rate & rhythm. No rubs, gallops or murmurs. Lungs: clear Abdomen: soft, nontender, nondistended. No hepatosplenomegaly. No bruits or masses. Good bowel  sounds. Extremities: no cyanosis, clubbing, rash, edema Neuro: alert & orientedx3, cranial nerves grossly intact. moves all 4 extremities w/o difficulty. Affect pleasant   ECG: SB 56 with anterior Qs and deep TWI. No change Personally reviewed   ASSESSMENT & PLAN:  1. Chronic Systolic Heart Failure: ICM, EF 30-35% (11/2012) EF (1/18)  - Echo 1/18 20-25% RV ok  - Echo 11/21 EF 20-25% RV ok - Echo today EF 20-25% RV ok Reviewed with him personally.   - Stable NYHA II-III - Volume status looks good. ReDS 31%  - Continue lasix as needed - Continue carvedilol 6.25 mg BID.  (tried 9.375 but failed due to dizziness) - Continue Entresto to 49/51 bid (dose cut back due to low BP. Can cut further as needed) - Continue Spironolactone 25 mg daily.  - Continue Jardiance 10  - He has seen EP in past and he doesn't want ICD - Have discussed Barostim with him. He is not interested at this time - Will have labwork with Dr. Shelah Lewandowsky next week  2. Hyperlipidemia - Per PCP. Continue Lipitor 40 mg daily. Goal LDL < 70  - Having some joint aches which I doubt are statin related but he will try stop-start for a week and see if there is any change  3. 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 Continue coumadin which is managed by Coumadin Clinic at [facility].  - Denies bleeding   4. CAD - s/p large anterior MI in 2010 - no s.s angina - s/p multiple stents.  - Continue statin. LDL < 70 Followed by Dr. Shelah Lewandowsky - Off ASA with coumadin.  5. DM2 - Continue Jardiance - Recent hgba1c 6.5  Arvilla Meres, MD  9:23 AM

## 2022-08-07 NOTE — Progress Notes (Signed)
Echocardiogram 2D Echocardiogram has been performed.  Toni Amend 08/07/2022, 9:05 AM

## 2022-08-24 ENCOUNTER — Other Ambulatory Visit (HOSPITAL_COMMUNITY): Payer: Self-pay | Admitting: Internal Medicine

## 2022-08-25 ENCOUNTER — Other Ambulatory Visit (HOSPITAL_COMMUNITY): Payer: Self-pay | Admitting: Internal Medicine

## 2022-08-25 DIAGNOSIS — Z7901 Long term (current) use of anticoagulants: Secondary | ICD-10-CM

## 2022-08-30 ENCOUNTER — Ambulatory Visit: Payer: Medicare Other | Attending: Cardiovascular Disease | Admitting: *Deleted

## 2022-08-30 DIAGNOSIS — Z5181 Encounter for therapeutic drug level monitoring: Secondary | ICD-10-CM | POA: Diagnosis not present

## 2022-08-30 LAB — POCT INR: INR: 2.5 (ref 2.0–3.0)

## 2022-08-30 NOTE — Patient Instructions (Signed)
Description   Continue taking warfarin 1 tablet daily except 1/2 tablet on Sundays and Thursdays.  Recheck in 6 weeks.  Stay consistent with your greens.  Coumadin Clinic [phone]

## 2022-10-11 ENCOUNTER — Ambulatory Visit: Payer: Medicare Other | Attending: Cardiovascular Disease

## 2022-10-11 DIAGNOSIS — Z5181 Encounter for therapeutic drug level monitoring: Secondary | ICD-10-CM | POA: Diagnosis not present

## 2022-10-11 LAB — POCT INR: INR: 3 (ref 2.0–3.0)

## 2022-10-11 NOTE — Patient Instructions (Signed)
Continue taking warfarin 1 tablet daily except 1/2 tablet on Sundays and Thursdays.  Recheck in 6 weeks.  Stay consistent with your greens.  Coumadin Clinic [phone]

## 2022-11-22 ENCOUNTER — Ambulatory Visit: Payer: Medicare Other

## 2022-11-22 DIAGNOSIS — Z5181 Encounter for therapeutic drug level monitoring: Secondary | ICD-10-CM

## 2022-11-22 LAB — POCT INR: INR: 3.1 — AB (ref 2.0–3.0)

## 2022-11-22 NOTE — Patient Instructions (Signed)
Continue taking warfarin 1 tablet daily except 1/2 tablet on Sundays and Thursdays.  Recheck in 6 weeks.  Stay consistent with your greens.  Coumadin Clinic [phone]

## 2023-01-03 ENCOUNTER — Ambulatory Visit: Payer: Medicare Other | Attending: Cardiology

## 2023-01-03 DIAGNOSIS — Z5181 Encounter for therapeutic drug level monitoring: Secondary | ICD-10-CM | POA: Diagnosis not present

## 2023-01-03 LAB — POCT INR: INR: 2.6 (ref 2.0–3.0)

## 2023-01-03 NOTE — Patient Instructions (Signed)
Continue taking warfarin 1 tablet daily except 1/2 tablet on Sundays and Thursdays.  Recheck in 6 weeks.  Stay consistent with your greens.  Coumadin Clinic [phone]

## 2023-02-14 ENCOUNTER — Ambulatory Visit: Payer: Medicare Other | Attending: Cardiovascular Disease | Admitting: *Deleted

## 2023-02-14 DIAGNOSIS — Z5181 Encounter for therapeutic drug level monitoring: Secondary | ICD-10-CM | POA: Diagnosis not present

## 2023-02-14 LAB — POCT INR: POC INR: 2.7

## 2023-02-14 NOTE — Patient Instructions (Signed)
Description   Continue taking warfarin 1 tablet daily except 1/2 tablet on Sundays and Thursdays.  Recheck in 6 weeks.  Stay consistent with your greens.  Coumadin Clinic [phone]

## 2023-03-02 ENCOUNTER — Other Ambulatory Visit (HOSPITAL_COMMUNITY): Payer: Self-pay | Admitting: Internal Medicine

## 2023-03-02 DIAGNOSIS — Z7901 Long term (current) use of anticoagulants: Secondary | ICD-10-CM

## 2023-03-02 DIAGNOSIS — I5022 Chronic systolic (congestive) heart failure: Secondary | ICD-10-CM

## 2023-03-28 ENCOUNTER — Ambulatory Visit: Payer: Medicare Other | Attending: Cardiology | Admitting: *Deleted

## 2023-03-28 DIAGNOSIS — I6350 Cerebral infarction due to unspecified occlusion or stenosis of unspecified cerebral artery: Secondary | ICD-10-CM | POA: Diagnosis not present

## 2023-03-28 DIAGNOSIS — Z5181 Encounter for therapeutic drug level monitoring: Secondary | ICD-10-CM

## 2023-03-28 LAB — POCT INR: INR: 2.4 (ref 2.0–3.0)

## 2023-03-28 NOTE — Patient Instructions (Signed)
Description   Continue taking warfarin 1 tablet daily except 1/2 tablet on Sundays and Thursdays.  Recheck in 6 weeks.  Stay consistent with your greens.  Coumadin Clinic [phone]

## 2023-05-09 ENCOUNTER — Ambulatory Visit: Payer: Medicare Other | Attending: Cardiovascular Disease

## 2023-05-09 DIAGNOSIS — Z5181 Encounter for therapeutic drug level monitoring: Secondary | ICD-10-CM

## 2023-05-09 DIAGNOSIS — I6350 Cerebral infarction due to unspecified occlusion or stenosis of unspecified cerebral artery: Secondary | ICD-10-CM

## 2023-05-09 LAB — POCT INR: INR: 2.6 (ref 2.0–3.0)

## 2023-05-09 NOTE — Patient Instructions (Signed)
Continue taking warfarin 1 tablet daily except 1/2 tablet on Sundays and Thursdays.  Recheck in 6 weeks.  Stay consistent with your greens.  Coumadin Clinic [phone]

## 2023-05-15 ENCOUNTER — Other Ambulatory Visit: Payer: Self-pay | Admitting: Internal Medicine

## 2023-06-04 ENCOUNTER — Encounter (HOSPITAL_COMMUNITY): Payer: Medicare Other | Admitting: Cardiology

## 2023-06-05 ENCOUNTER — Ambulatory Visit (HOSPITAL_COMMUNITY)
Admission: RE | Admit: 2023-06-05 | Discharge: 2023-06-05 | Disposition: A | Discharge status: Home / Self Care | Payer: Medicare Other | Source: Ambulatory Visit | Attending: Cardiology | Admitting: Cardiology

## 2023-06-05 ENCOUNTER — Other Ambulatory Visit (HOSPITAL_COMMUNITY): Payer: Self-pay

## 2023-06-05 ENCOUNTER — Telehealth (HOSPITAL_COMMUNITY): Payer: Self-pay | Admitting: Pharmacy Technician

## 2023-06-05 ENCOUNTER — Encounter (HOSPITAL_COMMUNITY): Payer: Self-pay | Admitting: Cardiology

## 2023-06-05 VITALS — BP 110/68 | HR 72 | Wt 208.8 lb

## 2023-06-05 DIAGNOSIS — I110 Hypertensive heart disease with heart failure: Secondary | ICD-10-CM | POA: Diagnosis present

## 2023-06-05 DIAGNOSIS — E785 Hyperlipidemia, unspecified: Secondary | ICD-10-CM | POA: Insufficient documentation

## 2023-06-05 DIAGNOSIS — I5022 Chronic systolic (congestive) heart failure: Secondary | ICD-10-CM | POA: Insufficient documentation

## 2023-06-05 DIAGNOSIS — Z7901 Long term (current) use of anticoagulants: Secondary | ICD-10-CM | POA: Insufficient documentation

## 2023-06-05 DIAGNOSIS — I2510 Atherosclerotic heart disease of native coronary artery without angina pectoris: Secondary | ICD-10-CM | POA: Diagnosis not present

## 2023-06-05 DIAGNOSIS — E119 Type 2 diabetes mellitus without complications: Secondary | ICD-10-CM | POA: Diagnosis present

## 2023-06-05 DIAGNOSIS — I513 Intracardiac thrombosis, not elsewhere classified: Secondary | ICD-10-CM | POA: Diagnosis not present

## 2023-06-05 DIAGNOSIS — I252 Old myocardial infarction: Secondary | ICD-10-CM | POA: Insufficient documentation

## 2023-06-05 NOTE — Patient Instructions (Signed)
There has been no changes to your medications.  Your physician recommends that you schedule a follow-up appointment in: 6 months ( August) ** PLEASE CALL THE OFFICE IN [place] TO ARRANGE YOUR FOLLOW UP APPOINTMENT.**  If you have any questions or concerns before your next appointment please send [place] a message through [place] or call our office at [phone].    TO LEAVE A MESSAGE FOR THE NURSE SELECT OPTION 2, PLEASE LEAVE A MESSAGE INCLUDING: YOUR NAME DATE OF BIRTH CALL BACK NUMBER REASON FOR CALL**this is important as we prioritize the call backs  YOU WILL RECEIVE A CALL BACK THE SAME DAY AS LONG AS YOU CALL BEFORE 4:00 PM  At the [facility], you and your health needs are our priority. As part of our continuing mission to provide you with exceptional heart care, we have created designated Provider Care Teams. These Care Teams include your primary Cardiologist (physician) and Advanced Practice Providers (APPs- Physician Assistants and Nurse Practitioners) who all work together to provide you with the care you need, when you need it.   You may see any of the following providers on your designated Care Team at your next follow up: Dr Arvilla Meres Dr Marca Ancona Dr. Dorthula Nettles Dr. Clearnce Hasten Amy Filbert Schilder, NP Robbie Lis, [place] [facility] [place], [place] Brynda Peon, NP [place] Lee, NP Karle Plumber, PharmD   Please be sure to bring in all your medications bottles to every appointment.    Thank you for choosing [facility]

## 2023-06-05 NOTE — Progress Notes (Signed)
ADVANCED HEART FAILURE FOLLOW UP CLINIC NOTE  Referring Physician: Kaleen Mask, *  Primary Care: Kaleen Mask, MD Primary Cardiologist:  HPI: Marcus Ramos is a 83 y.o. male with a PMH of DM2, HTN, CAD and chronic systolic HF who presents for follow up of chronic systolic HF.       He suffered a large anterolateral MI in 6/10 c/b cardiogenic shock. He was treated with emergent bare-metal stenting of the LAD, as well as a  drug-eluting stent to the circumflex. S/P PTCA RCA. Ejection fraction was 25% with global hypokinesis, and question of a left ventricular thrombus. He was placed on Coumadin as well as aspirin and Plavix. 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   Had echo 8/10 which confirmed EF 25-30%. MRI 2010 EF 23%. With full thickness scar. No signifcant viability.   We previously arranged for him to see Dr. Johney Frame to discuss ICD placement. He saw Dr. Johney Frame and was set-up for an ICD but he cancelled his implant appt saying he didn't want a device at that time.   ECHO 05/15/16 EF 20-25% apical AK RV ok.      SUBJECTIVE:  Patient overall states that he is doing well.  For a few weeks over the summer he had to self titrate down his Entresto to a half dose in the morning blood pressure, but this is resolved.  He reports that he has gotten "slower", but is still able to carry out his activities of daily living.  He does note occasional chest discomfort with moderate to significant exertion, but it improves reliably with rest.  PMH, current medications, allergies, social history, and family history reviewed in epic.  PHYSICAL EXAM: Vitals:   06/05/23 1152  BP: 110/68  Pulse: 72  SpO2: 97%   GENERAL: Well nourished and in no apparent distress at rest.  PULM:  Normal work of breathing, clear to auscultation bilaterally. Respirations are unlabored.  CARDIAC:  JVP: Not elevated         Normal rate with  regular rhythm. No murmurs, rubs or gallops.  Trace edema. Warm and well perfused extremities. ABDOMEN: Soft, non-tender, non-distended. NEUROLOGIC: Patient is oriented x3 with no focal or lateralizing neurologic deficits.    DATA REVIEW  Heart failure review: - Classification: Heart failure with reduced EF - Etiology: Ischemic - NYHA Class: II-III - Volume status: Euvolemic - ACEi/ARB/ARNI: Maximally tolerated dose - Aldosterone antagonist: Maximally tolerated dose - Beta-blocker: Maximally tolerated dose - Digoxin: Not indicated - Hydralazine/Nitrates: Not indicated - SGLT2i: Maximally tolerated dose - GLP-1: Not a candidate - Advanced therapies: Not a candidate - ICD: Declined ICD placement  ASSESSMENT & PLAN:  Chronic systolic HF: Related to prior anterior infarct, last ejection fraction 20 to 25% with normal RV.  Symptoms are stable since his last visit, appears euvolemic on exam.  Has had difficulty with medication titration in the past and currently feels well.  Has had to cut down his Sherryll Burger previously but right now on the 49/51 dose. - Continue carvedilol 6.25 mg BID.  (tried 9.375 but failed due to dizziness) - Continue Entresto to 49/51 bid - Continue Spironolactone 25 mg daily.  - Continue Jardiance 10  - He has seen EP in past and he doesn't want ICD -Discussed Barostim with Dr. Gala Romney, declined  HLD: Joint stiffness improved with Tylenol and occasional gabapentin.  Discussed that he could cut back if worsening. -Continue Lipitor 40 mg daily  Mural thrombus:  -Continue home Coumadin  CAD: Large anterior MI in 2010, very rare angina that improves with rest, no nitroglycerin use. -Continue warfarin, statin  DM2: -Continue Jardiance  Follow up in 6 months  Clearnce Hasten, MD Advanced Heart Failure Mechanical Circulatory Support 06/05/23

## 2023-06-05 NOTE — Telephone Encounter (Signed)
Advanced Heart Failure Patient Advocate Encounter  Was consulted regarding copay of Jardiance/Entresto. Nothing is coming up on eligibility search. I can only see Medicare B cards in the patients profile. I do see where he had commercial insurance in 2021. I advised the clinic to have the patient bring in his up to date insurance card. He may just need new co-pay cards. Not sure if his insurance status has changed.  Archer Asa, CPhT

## 2023-06-20 ENCOUNTER — Ambulatory Visit: Payer: Medicare Other | Attending: Cardiovascular Disease

## 2023-06-20 DIAGNOSIS — I6350 Cerebral infarction due to unspecified occlusion or stenosis of unspecified cerebral artery: Secondary | ICD-10-CM

## 2023-06-20 DIAGNOSIS — Z5181 Encounter for therapeutic drug level monitoring: Secondary | ICD-10-CM | POA: Diagnosis not present

## 2023-06-20 LAB — POCT INR: INR: 2.7 (ref 2.0–3.0)

## 2023-06-20 NOTE — Patient Instructions (Signed)
Continue taking warfarin 1 tablet daily except 1/2 tablet on Sundays and Thursdays.  Recheck in 6 weeks.  Stay consistent with your greens.  Coumadin Clinic [phone]

## 2023-06-22 ENCOUNTER — Ambulatory Visit
Admission: EM | Admit: 2023-06-22 | Discharge: 2023-06-22 | Disposition: A | Discharge status: Home / Self Care | Attending: Physician Assistant | Admitting: Physician Assistant

## 2023-06-22 DIAGNOSIS — H1033 Unspecified acute conjunctivitis, bilateral: Secondary | ICD-10-CM | POA: Diagnosis not present

## 2023-06-22 MED ORDER — POLYMYXIN B-TRIMETHOPRIM 10000-0.1 UNIT/ML-% OP SOLN: 1.0000 [drp] | OPHTHALMIC | 0 refills | Status: AC

## 2023-06-22 NOTE — ED Triage Notes (Signed)
"  My left started watering and draining on Friday and now that has moved to my right eye with redness". No pain in the eyes "some itching and burning". "I am getting over a cold/sinus pressure/pain/drainage". No fever.

## 2023-06-22 NOTE — ED Provider Notes (Signed)
 [facility]    CSN: [id number] Arrival date & time: 06/22/23  0835      History   Chief Complaint Chief Complaint  Patient presents with   Eye Problem    HPI Marcus Ramos is a 82 y.o. male.   Patient here today for evaluation of watery eyes with drainage and redness that started in his left eye on Friday but has now moved to both eyes.  He reports that there is some itching and burning but no significant pain.  He denies any injury.  He has not had any vision changes.  He has not had fever and denies any headache, nausea or vomiting.  The history is provided by the patient.  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 Staged drug eluting stenting of circumflex artery   Type II or unspecified type diabetes mellitus without mention of complication, not stated as uncontrolled     Patient Active Problem List   Diagnosis Date Noted   Encounter for therapeutic drug monitoring 06/21/2013   Long term (current) use of anticoagulants 07/19/2010   HYPERLIPIDEMIA-MIXED 02/21/2009   MURAL THROMBUS, LEFT VENTRICLE 02/21/2009   GOUT, UNSPECIFIED 12/07/2008   SYSTOLIC HEART FAILURE, CHRONIC 12/07/2008   CAD, NATIVE VESSEL 11/16/2008   Cerebral artery occlusion with cerebral infarction (HCC) 11/16/2008   DIABETES MELLITUS, TYPE II 11/12/2008   OBESITY 11/12/2008   ISCHEMIC CARDIOMYOPATHY 11/12/2008   History of cardiovascular disorder 11/12/2008   HYPOPOTASSEMIA 11/01/2008    Past Surgical History:  Procedure Laterality Date   UMBILICAL HERNIA REPAIR   1983       Home Medications    Prior to Admission medications   Medication Sig Start Date End Date Taking? Authorizing Provider  trimethoprim-polymyxin b (POLYTRIM) ophthalmic solution Place 1 drop into both eyes every 4 (four) hours for 7 days. 06/22/23 06/29/23 Yes Tomi Bamberger, PA-C  Acetaminophen (TYLENOL 8 HOUR ARTHRITIS PAIN PO) Take 800 mg by mouth daily.    [provider]  allopurinol (ZYLOPRIM) 100 MG tablet Take 50 mg by mouth daily.    [provider]  atorvastatin (LIPITOR) 40 MG tablet TAKE 1 TABLET DAILY 05/15/23   Bensimhon, Bevelyn Buckles, MD  carvedilol (COREG) 6.25 MG tablet TAKE 1 TABLET TWICE A DAY 03/03/23   Bensimhon, Bevelyn Buckles, MD  colchicine 0.6 MG tablet Take 0.6 mg by mouth as needed.      [provider]  empagliflozin (JARDIANCE) 10 MG TABS tablet TAKE 1 TABLET BY MOUTH ONCE DAILY BEFORE BREAKFAST 08/26/22   Bensimhon, Bevelyn Buckles, MD  ENTRESTO 49-51 MG Take 1 tablet by mouth twice daily 12/10/21   Bensimhon, Bevelyn Buckles, MD  gabapentin (NEURONTIN) 800 MG tablet SMARTSIG:0.5 Tablet(s) By Mouth Every 12 Hours 09/10/21   [provider]  nitroGLYCERIN (NITROSTAT) 0.4 MG SL tablet Place 1 tablet (0.4 mg total) under the tongue every 5 (five) minutes as needed. 08/07/22   Bensimhon, Bevelyn Buckles, MD  spironolactone (ALDACTONE) 25 MG tablet TAKE 1 TABLET DAILY 05/15/23   Bensimhon, Reuel Boom  R, MD  warfarin (JANTOVEN) 5 MG tablet TAKE 1 TABLET DAILY EXCEPT 1/2 TABLET ON SUNDAYS AND  THURSDAYS OR AS DIRECTED BYCOUMADIN CLINIC 03/03/23   Bensimhon, Bevelyn Buckles, MD    Family History Family History  Problem Relation Age of Onset   Coronary artery disease Mother    Hypertension Mother    Coronary artery disease Father    Hypertension Father     Social History Social History   Tobacco Use   Smoking status: Never   Smokeless tobacco: Never  Vaping Use   Vaping status: Never Used  Substance Use Topics   Alcohol use: No   Drug use: Never      Allergies   Patient has no known allergies.   Review of Systems Review of Systems  Constitutional:  Negative for chills and fever.  Eyes:  Positive for discharge, redness and itching. Negative for photophobia, pain and visual disturbance.  Respiratory:  Negative for shortness of breath.   Neurological:  Negative for numbness.     Physical Exam Triage Vital Signs ED Triage Vitals  Encounter Vitals Group     BP 06/22/23 0905 94/63     Systolic BP Percentile --      Diastolic BP Percentile --      Pulse Rate 06/22/23 0905 84     Resp 06/22/23 0905 18     Temp 06/22/23 0905 98.3 F (36.8 C)     Temp Source 06/22/23 0905 Oral     SpO2 06/22/23 0905 98 %     Weight 06/22/23 0904 208 lb 12.4 oz (94.7 kg)     Height 06/22/23 0904 5\' 11"  (1.803 m)     Head Circumference --      Peak Flow --      Pain Score 06/22/23 0902 0     Pain Loc --      Pain Education --      Exclude from Growth Chart --    No data found.  Updated Vital Signs BP 94/63 (BP Location: Left Arm) Comment: "my BP changes and goes back and forth"  Pulse 84   Temp 98.3 F (36.8 C) (Oral)   Resp 18   Ht 5\' 11"  (1.803 m)   Wt 208 lb 12.4 oz (94.7 kg)   SpO2 98%   BMI 29.12 kg/m   Visual Acuity Right Eye Distance: UTO, Normally wears glasses, not with him today. Left Eye Distance: UTO, Normally wears glasses, not with him today. Bilateral Distance: UTO, Normally wears glasses, not with him today.  Right Eye Near:   Left Eye Near:    Bilateral Near:     Physical Exam Vitals and nursing note reviewed.  Constitutional:      General: He is not in acute distress.    Appearance: Normal appearance. He is not ill-appearing.  HENT:     Head: Normocephalic and atraumatic.  Eyes:     Extraocular Movements: Extraocular movements intact.     Pupils: Pupils are equal, round, and reactive to light.     Comments: Bilateral conjunctiva injected with mild crusting noted to lashes  Cardiovascular:      Rate and Rhythm: Normal rate.  Pulmonary:     Effort: Pulmonary effort is normal.  Neurological:     Mental Status: He is alert.  Psychiatric:        Mood and Affect: Mood normal.        Behavior: Behavior normal.        Thought Content:  Thought content normal.      UC Treatments / Results  Labs (all labs ordered are listed, but only abnormal results are displayed) Labs Reviewed - No data to display  EKG   Radiology No results found.  Procedures Procedures (including critical care time)  Medications Ordered in UC Medications - No data to display  Initial Impression / Assessment and Plan / UC Course  I have reviewed the triage vital signs and the nursing notes.  Pertinent labs & imaging results that were available during my care of the patient were reviewed by me and considered in my medical decision making (see chart for details).    Will treat to cover conjunctivitis with Polytrim drops and advised follow-up if no gradual improvement or with any further concerns.  Final Clinical Impressions(s) / UC Diagnoses   Final diagnoses:  Acute conjunctivitis of both eyes, unspecified acute conjunctivitis type   Discharge Instructions   None    ED Prescriptions     Medication Sig Dispense Auth. Provider   trimethoprim-polymyxin b (POLYTRIM) ophthalmic solution Place 1 drop into both eyes every 4 (four) hours for 7 days. 10 mL Tomi Bamberger, PA-C      PDMP not reviewed this encounter.   Tomi Bamberger, PA-C 06/22/23 [phone]

## 2023-08-01 ENCOUNTER — Ambulatory Visit: Payer: Medicare Other | Attending: Cardiovascular Disease

## 2023-08-01 DIAGNOSIS — Z5181 Encounter for therapeutic drug level monitoring: Secondary | ICD-10-CM

## 2023-08-01 DIAGNOSIS — I6350 Cerebral infarction due to unspecified occlusion or stenosis of unspecified cerebral artery: Secondary | ICD-10-CM | POA: Diagnosis not present

## 2023-08-01 LAB — POCT INR: INR: 3.1 — AB (ref 2.0–3.0)

## 2023-08-01 NOTE — Patient Instructions (Signed)
Continue taking warfarin 1 tablet daily except 1/2 tablet on Sundays and Thursdays.  Recheck in 6 weeks.  Stay consistent with your greens.  Coumadin Clinic [phone]

## 2023-08-13 ENCOUNTER — Other Ambulatory Visit (HOSPITAL_COMMUNITY): Payer: Self-pay | Admitting: Internal Medicine

## 2023-08-13 DIAGNOSIS — Z7901 Long term (current) use of anticoagulants: Secondary | ICD-10-CM

## 2023-08-13 NOTE — Telephone Encounter (Signed)
 Prescription refill request received for warfarin Lov: 06/05/23 (CHF)  Next INR check: 09/12/23 Warfarin tablet strength: 5mg   Appropriate dose. Refill sent.

## 2023-09-12 ENCOUNTER — Ambulatory Visit: Attending: Cardiovascular Disease | Admitting: *Deleted

## 2023-09-12 DIAGNOSIS — I6350 Cerebral infarction due to unspecified occlusion or stenosis of unspecified cerebral artery: Secondary | ICD-10-CM

## 2023-09-12 DIAGNOSIS — Z5181 Encounter for therapeutic drug level monitoring: Secondary | ICD-10-CM

## 2023-09-12 LAB — POCT INR: INR: 2.6 (ref 2.0–3.0)

## 2023-09-12 NOTE — Patient Instructions (Addendum)
 Description   Continue taking warfarin 1 tablet daily except 1/2 tablet on Sundays and Thursdays.  Recheck in 6 weeks.  Stay consistent with your greens.  Coumadin  Clinic [phone]

## 2023-10-31 ENCOUNTER — Ambulatory Visit: Attending: Cardiovascular Disease

## 2023-10-31 DIAGNOSIS — Z5181 Encounter for therapeutic drug level monitoring: Secondary | ICD-10-CM | POA: Diagnosis not present

## 2023-10-31 LAB — POCT INR: INR: 2.7 (ref 2.0–3.0)

## 2023-10-31 NOTE — Progress Notes (Signed)
Please see anticoagulation encounter.

## 2023-10-31 NOTE — Patient Instructions (Signed)
 Description   Continue taking warfarin 1 tablet daily except 1/2 tablet on Sundays and Thursdays.  Recheck in 6 weeks.  Stay consistent with your greens.  Coumadin  Clinic [phone]

## 2023-11-25 ENCOUNTER — Telehealth (HOSPITAL_COMMUNITY): Payer: Self-pay | Admitting: Cardiology

## 2023-11-25 NOTE — Telephone Encounter (Signed)
 Called to confirm/remind patient of their appointment at the [facility] on 11/25/23.   Appointment:   [x] Confirmed  [] Left mess   [] No answer/No voice mail  [] VM Full/unable to leave message  [] Phone not in service  Patient reminded to bring all medications and/or complete list.  Confirmed patient has transportation. Gave directions, instructed to utilize valet parking.

## 2023-11-27 ENCOUNTER — Other Ambulatory Visit (HOSPITAL_COMMUNITY): Payer: Self-pay

## 2023-11-27 ENCOUNTER — Ambulatory Visit (HOSPITAL_COMMUNITY)
Admission: RE | Admit: 2023-11-27 | Discharge: 2023-11-27 | Disposition: A | Discharge status: Home / Self Care | Source: Ambulatory Visit | Attending: Cardiology | Admitting: Cardiology

## 2023-11-27 ENCOUNTER — Encounter (HOSPITAL_COMMUNITY): Payer: Self-pay | Admitting: Cardiology

## 2023-11-27 VITALS — BP 102/60 | HR 59 | Wt 205.5 lb

## 2023-11-27 DIAGNOSIS — I513 Intracardiac thrombosis, not elsewhere classified: Secondary | ICD-10-CM | POA: Insufficient documentation

## 2023-11-27 DIAGNOSIS — Z955 Presence of coronary angioplasty implant and graft: Secondary | ICD-10-CM | POA: Diagnosis not present

## 2023-11-27 DIAGNOSIS — I5022 Chronic systolic (congestive) heart failure: Secondary | ICD-10-CM | POA: Diagnosis present

## 2023-11-27 DIAGNOSIS — M2560 Stiffness of unspecified joint, not elsewhere classified: Secondary | ICD-10-CM | POA: Insufficient documentation

## 2023-11-27 DIAGNOSIS — Z7901 Long term (current) use of anticoagulants: Secondary | ICD-10-CM | POA: Diagnosis not present

## 2023-11-27 DIAGNOSIS — I110 Hypertensive heart disease with heart failure: Secondary | ICD-10-CM | POA: Insufficient documentation

## 2023-11-27 DIAGNOSIS — I2510 Atherosclerotic heart disease of native coronary artery without angina pectoris: Secondary | ICD-10-CM | POA: Diagnosis not present

## 2023-11-27 DIAGNOSIS — E785 Hyperlipidemia, unspecified: Secondary | ICD-10-CM | POA: Insufficient documentation

## 2023-11-27 DIAGNOSIS — I252 Old myocardial infarction: Secondary | ICD-10-CM | POA: Diagnosis not present

## 2023-11-27 DIAGNOSIS — I6350 Cerebral infarction due to unspecified occlusion or stenosis of unspecified cerebral artery: Secondary | ICD-10-CM | POA: Diagnosis not present

## 2023-11-27 DIAGNOSIS — Z7984 Long term (current) use of oral hypoglycemic drugs: Secondary | ICD-10-CM | POA: Insufficient documentation

## 2023-11-27 DIAGNOSIS — E119 Type 2 diabetes mellitus without complications: Secondary | ICD-10-CM | POA: Insufficient documentation

## 2023-11-27 DIAGNOSIS — Z79899 Other long term (current) drug therapy: Secondary | ICD-10-CM | POA: Diagnosis not present

## 2023-11-27 MED ORDER — SACUBITRIL-VALSARTAN 24-26 MG PO TABS: 1.0000 | ORAL_TABLET | Freq: Two times a day (BID) | ORAL | 11 refills | Status: AC

## 2023-11-27 MED ORDER — EMPAGLIFLOZIN 10 MG PO TABS: 10.0000 mg | ORAL_TABLET | Freq: Every day | ORAL | 11 refills | Status: AC

## 2023-11-27 NOTE — Progress Notes (Signed)
   ADVANCED [facility] FAILURE FOLLOW UP CLINIC NOTE  Referring Physician: Loring Tanda Ramos, *  Primary Care: Marcus Tanda Mae, MD Primary Cardiologist:  HPI: Marcus Ramos is a 83 y.o. male with a PMH of DM2, HTN, CAD and chronic systolic HF who presents for follow up of chronic systolic HF.       He suffered a large anterolateral MI in 6/10 c/b cardiogenic shock. He was treated with emergent bare-metal stenting of the LAD, as well as a  drug-eluting stent to the circumflex. S/P PTCA RCA. Ejection fraction was 25% with global hypokinesis, and question of a left ventricular thrombus. He was placed on Coumadin  as well as aspirin  and Plavix. 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   Had echo 8/10 which confirmed EF 25-30%. MRI 2010 EF 23%. With full thickness scar. No signifcant viability.   We previously arranged for him to see Dr. Kelsie to discuss ICD placement. He saw Dr. Kelsie and was set-up for an ICD but he cancelled his implant appt saying he didn't want a device at that time.   ECHO 05/15/16 EF 20-25% apical AK RV ok.      SUBJECTIVE:  No issues since his last visit. Has been noticing some more low blood pressures over the past few months, included some in the 80s and 1 SBP of 78. He is otherwise doing well, no shortness of breath, chest pain, orthopnea, PND.   PMH, current medications, allergies, social history, and family history reviewed in epic.  PHYSICAL EXAM: Vitals:   11/27/23 1058  BP: 102/60  Pulse: (!) 59  SpO2: 97%   GENERAL: NAD, well appearing PULM:  Normal work of breathing, CTAB CARDIAC:  JVP: flat         Normal rate with regular rhythm. No murmurs, rubs or gallops.  No edema. Warm and well perfused extremities. ABDOMEN: Soft, non-tender, non-distended. NEUROLOGIC: Patient is oriented x3 with no focal or lateralizing neurologic deficits.     DATA REVIEW  Heart failure review: -  Classification: Heart failure with reduced EF - Etiology: Ischemic - NYHA Class: II-III - Volume status: Euvolemic - ACEi/ARB/ARNI: Maximally tolerated dose - Aldosterone antagonist: Maximally tolerated dose - Beta-blocker: Maximally tolerated dose - Digoxin: Not indicated - Hydralazine/Nitrates: Not indicated - SGLT2i: Maximally tolerated dose - GLP-1: Not a candidate - Advanced therapies: Not a candidate - ICD: Declined ICD placement  ASSESSMENT & PLAN:  Chronic systolic HF: Related to prior anterior infarct, last ejection fraction 20 to 25% with normal RV.  No change in his symptoms, stalbe NYHA class II-III. Worsening BP and having to cut his entresto  dose more. - Decrease entresto  to 24/26mg  BID - Continue carvedilol  6.25mg  BID - Continue spironolactone  25mg  daily - Continue jardiance  10mg  daily - He has seen EP in past and he doesn't want ICD -Discussed Barostim with Dr. Cherrie, declined  HLD: Joint stiffness improved with Tylenol and occasional gabapentin.  Discussed that he could cut back on statin if worsening, no change since last visit -Continue Lipitor 40 mg daily  Mural thrombus:  -Continue home Coumadin   CAD: Large anterior MI in 2010, very rare angina that improves with rest, no nitroglycerin  use. -Continue warfarin, statin  DM2: -Continue Jardiance   Follow up in 6 months  Morene Brownie, MD [facility] Mechanical Circulatory Support 11/27/23

## 2023-11-27 NOTE — Patient Instructions (Addendum)
 Medication Changes:  DECREASE ENTRESTO  TO 24/26MG  TWICE DAILY   JARDIANCE  10MG  ONCE DAILY REFILLED   Follow-Up in: 6 months (around February) PLEASE CALL OUR OFFICE AROUND NOVEMBER TO GET SCHEDULED FOR YOUR APPOINTMENT. PHONE NUMBER IS [phone] OPTION 2   At the [facility], you and your health needs are our priority. We have a designated team specialized in the treatment of Heart Failure. This Care Team includes your primary Heart Failure Specialized Cardiologist (physician), Advanced Practice Providers (APPs- Physician Assistants and Nurse Practitioners), and Pharmacist who all work together to provide you with the care you need, when you need it.   You may see any of the following providers on your designated Care Team at your next follow up:  Dr. Toribio Fuel Dr. Ezra Shuck Dr. Ria Commander Dr. Odis Brownie Greig Mosses, NP Caffie Shed, [place] [facility] [place], [place] Beckey Coe, NP [place] Lee, NP Tinnie Redman, PharmD   Please be sure to bring in all your medications bottles to every appointment.   Need to Contact Us :  If you have any questions or concerns before your next appointment please send us  a message through [place] or call our office at [phone].    TO LEAVE A MESSAGE FOR THE NURSE SELECT OPTION 2, PLEASE LEAVE A MESSAGE INCLUDING: YOUR NAME DATE OF BIRTH CALL BACK NUMBER REASON FOR CALL**this is important as we prioritize the call backs  YOU WILL RECEIVE A CALL BACK THE SAME DAY AS LONG AS YOU CALL BEFORE 4:00 PM

## 2023-11-28 ENCOUNTER — Other Ambulatory Visit (HOSPITAL_COMMUNITY): Payer: Self-pay

## 2023-12-12 ENCOUNTER — Ambulatory Visit: Attending: Cardiovascular Disease

## 2023-12-12 DIAGNOSIS — I6350 Cerebral infarction due to unspecified occlusion or stenosis of unspecified cerebral artery: Secondary | ICD-10-CM | POA: Diagnosis not present

## 2023-12-12 DIAGNOSIS — Z5181 Encounter for therapeutic drug level monitoring: Secondary | ICD-10-CM

## 2023-12-12 LAB — POCT INR: INR: 2.2 (ref 2.0–3.0)

## 2023-12-12 NOTE — Patient Instructions (Signed)
 Continue taking warfarin 1 tablet daily except 1/2 tablet on Sundays and Thursdays.  Recheck in 6 weeks.  Stay consistent with your greens.  Coumadin  Clinic [phone]

## 2023-12-12 NOTE — Progress Notes (Signed)
 INR 2.2 Please see anticoagulation encounter Continue taking warfarin 1 tablet daily except 1/2 tablet on Sundays and Thursdays.  Recheck in 6 weeks.  Stay consistent with your greens.  Coumadin  Clinic [phone]

## 2024-01-23 ENCOUNTER — Ambulatory Visit: Attending: Cardiovascular Disease

## 2024-01-23 DIAGNOSIS — I6350 Cerebral infarction due to unspecified occlusion or stenosis of unspecified cerebral artery: Secondary | ICD-10-CM

## 2024-01-23 DIAGNOSIS — Z5181 Encounter for therapeutic drug level monitoring: Secondary | ICD-10-CM | POA: Diagnosis not present

## 2024-01-23 LAB — POCT INR: INR: 2.6 (ref 2.0–3.0)

## 2024-01-23 NOTE — Patient Instructions (Signed)
 Continue taking warfarin 1 tablet daily except 1/2 tablet on Sundays and Thursdays.  Recheck in 6 weeks.  Stay consistent with your greens.  Coumadin  Clinic [phone]

## 2024-01-23 NOTE — Progress Notes (Signed)
 INR 2.6 Please see anticoagulation encounter Continue taking warfarin 1 tablet daily except 1/2 tablet on Sundays and Thursdays.  Recheck in 6 weeks.  Stay consistent with your greens.  Coumadin  Clinic [phone]

## 2024-01-26 ENCOUNTER — Ambulatory Visit (INDEPENDENT_AMBULATORY_CARE_PROVIDER_SITE_OTHER)

## 2024-01-26 ENCOUNTER — Encounter: Payer: Self-pay | Admitting: Emergency Medicine

## 2024-01-26 ENCOUNTER — Ambulatory Visit: Admission: EM | Admit: 2024-01-26 | Discharge: 2024-01-26 | Disposition: A | Discharge status: Home / Self Care

## 2024-01-26 DIAGNOSIS — M545 Low back pain, unspecified: Secondary | ICD-10-CM

## 2024-01-26 DIAGNOSIS — S39012A Strain of muscle, fascia and tendon of lower back, initial encounter: Secondary | ICD-10-CM | POA: Diagnosis not present

## 2024-01-26 MED ORDER — KETOROLAC TROMETHAMINE 30 MG/ML IJ SOLN
30.0000 mg | Freq: Once | INTRAMUSCULAR | Status: AC
  Administered 2024-01-26: 30 mg via INTRAMUSCULAR

## 2024-01-26 MED ORDER — DICLOFENAC SODIUM 50 MG PO TBEC: 50.0000 mg | DELAYED_RELEASE_TABLET | Freq: Two times a day (BID) | ORAL | 1 refills | Status: DC

## 2024-01-26 NOTE — ED Provider Notes (Signed)
 [facility]  Note:  This document was prepared using Conservation officer, historic buildings and may include unintentional dictation errors.  MRN: [id number] DOB: [birth year]  Subjective:   Marcus Ramos is a 83 y.o. male presenting for midline lower back pain after a fall 1 to 2 hours ago at home.  Patient reports that he was lifting a truck bed cover when the strap slipped causing him to fall backwards onto his sacrum.  Patient reports after hitting the ground he had lower back pain and then some mid spine.  Patient denies any lower back pain, trauma, previous injuries.  Patient denies any loss of consciousness or altered mental status.  Patient states when he did fall backwards he did roll back and hit his head very lightly but denies any current symptoms of headache, dizziness, blurred vision.  Patient reports that he took Tylenol extra-strength after lunch earlier today but nothing since the incident occurred.  No current facility-administered medications for this encounter.  Current Outpatient Medications:    acetaminophen (TYLENOL) 650 MG CR tablet, Take 1,300 mg by mouth as needed for pain., Disp: , Rfl:    allopurinol  (ZYLOPRIM ) 100 MG tablet, Take 50 mg by mouth daily., Disp: , Rfl:    atorvastatin  (LIPITOR) 40 MG tablet, TAKE 1 TABLET DAILY, Disp: 90 tablet, Rfl: 3   carvedilol  (COREG ) 6.25 MG tablet, TAKE 1 TABLET TWICE A DAY, Disp: 180 tablet, Rfl: 3   diclofenac (VOLTAREN) 50 MG EC tablet, Take 1 tablet (50 mg total) by mouth 2 (two) times daily., Disp: 30 tablet, Rfl: 1   empagliflozin  (JARDIANCE ) 10 MG TABS tablet, Take 1 tablet (10 mg total) by mouth daily before breakfast., Disp: 30 tablet, Rfl: 11   gabapentin (NEURONTIN) 800 MG tablet, SMARTSIG:0.5 Tablet(s) By Mouth Every 12 Hours, Disp: , Rfl:    sacubitril -valsartan  (ENTRESTO ) 24-26 MG, Take 1 tablet by mouth 2 (two) times daily., Disp: 60 tablet, Rfl: 11   spironolactone  (ALDACTONE ) 25 MG tablet, TAKE 1 TABLET  DAILY, Disp: 90 tablet, Rfl: 3   warfarin (JANTOVEN ) 5 MG tablet, TAKE 1 TABLET DAILY EXCEPT 1/2 TABLET ON SUNDAYS AND  THURSDAYS OR AS DIRECTED BYCOUMADIN CLINIC, Disp: 100 tablet, Rfl: 0   clotrimazole-betamethasone (LOTRISONE) cream, SMARTSIG:sparingly Topical Every 12 Hours, Disp: , Rfl:    colchicine 0.6 MG tablet, Take 0.6 mg by mouth as needed.   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 Staged drug eluting stenting of circumflex artery   Type II or unspecified type diabetes mellitus without mention of complication, not stated as uncontrolled      Past Surgical History:  Procedure Laterality Date   UMBILICAL HERNIA REPAIR  1983    Family History  Problem Relation Age of Onset   Coronary artery disease Mother    Hypertension Mother    Coronary artery disease Father    Hypertension Father     Social  History   Tobacco Use   Smoking status: Never   Smokeless tobacco: Never  Vaping Use   Vaping status: Never Used  Substance Use Topics   Alcohol use: No   Drug use: Never    ROS Refer to HPI for ROS details.  Objective:   Vitals: BP 137/81 (BP Location: Left Arm)   Pulse 64   Temp 98 F (36.7 C) (Oral)   Resp 14   SpO2 95%   Physical Exam Vitals and nursing note reviewed.  Constitutional:      General: He is not in acute distress.    Appearance: Normal appearance. He is not ill-appearing or toxic-appearing.  HENT:     Head:  Normocephalic.  Cardiovascular:     Rate and Rhythm: Normal rate.  Pulmonary:     Effort: Pulmonary effort is normal. No respiratory distress.  Musculoskeletal:     Lumbar back: Spasms, tenderness and bony tenderness present. No swelling or deformity. Decreased range of motion. Negative right straight leg raise test and negative left straight leg raise test. No scoliosis.  Skin:    General: Skin is warm and dry.     Capillary Refill: Capillary refill takes less than 2 seconds.  Neurological:     General: No focal deficit present.     Mental Status: He is alert and oriented to person, place, and time.  Psychiatric:        Mood and Affect: Mood normal.        Behavior: Behavior normal.     Procedures  No results found for this or any previous visit (from the past 24 hours).  DG Lumbar Spine Complete Result Date: 01/26/2024 CLINICAL DATA:  Low back pain.  Recent fall EXAM: LUMBAR SPINE - COMPLETE 4+ VIEW COMPARISON:  None Available. FINDINGS: Five non-rib-bearing lumbar vertebra. No evidence of acute fracture or compression deformity. Posterior elements are suboptimally assessed on the current exam. Diffuse spondylosis with anterior spurring. Multilevel facet hypertrophy. No sacroiliac diastasis. Aortic atherosclerosis. IMPRESSION: 1. No evidence of acute fracture or compression deformity. 2. Multilevel degenerative change. Electronically Signed   By: Andrea Gasman M.D.   On: 01/26/2024 19:28     Assessment and Plan :     Discharge Instructions       1. Low back strain, initial encounter (Primary) - ketorolac (TORADOL) 30 MG/ML injection 30 mg given in UC for acute pain secondary to fall with back pain. - DG Lumbar Spine Complete x-ray completed in UC shows no acute fracture or dislocation of the lumbar spine, multilevel degenerative changes noted by radiologist. - diclofenac (VOLTAREN) 50 MG EC tablet; Take 1 tablet (50 mg total) by mouth 2 (two) times daily.  Dispense: 30  tablet; Refill: 1 -Continue to monitor symptoms for any change in severity if there is any escalation of current symptoms or development of new symptoms follow-up in ER for further evaluation and management.       Melicia Esqueda B Malaiah Viramontes   Neave Lenger, [place], [place] 01/26/24 1934

## 2024-01-26 NOTE — Discharge Instructions (Addendum)
  1. Low back strain, initial encounter (Primary) - ketorolac (TORADOL) 30 MG/ML injection 30 mg given in UC for acute pain secondary to fall with back pain. - DG Lumbar Spine Complete x-ray completed in UC shows no acute fracture or dislocation of the lumbar spine, multilevel degenerative changes noted by radiologist. - diclofenac (VOLTAREN) 50 MG EC tablet; Take 1 tablet (50 mg total) by mouth 2 (two) times daily.  Dispense: 30 tablet; Refill: 1 -Continue to monitor symptoms for any change in severity if there is any escalation of current symptoms or development of new symptoms follow-up in ER for further evaluation and management.

## 2024-01-26 NOTE — ED Triage Notes (Addendum)
 Pt reports fall backward onto bottom about 1.5hrs ago. Pt was pulling the back cover off of pick up truck when a strap snapped and he was flung backward. Hit sacrum initially, but feels like something pulled in his lower back. Describes aching pain in these areas that has been continuous since incident. Pt notes once he hit the ground he swayed back and hit his head. Pt reports it was not very hard, but initially gave him a headache that has since resolved. Pt is on coumadin , but takes his dose in the evening - last dose: yesterday, 2.5mg  at 10pm. Denies dizziness and lightheadedness. Denies ROM deficit.

## 2024-02-04 ENCOUNTER — Encounter: Payer: Self-pay | Admitting: Emergency Medicine

## 2024-02-04 ENCOUNTER — Ambulatory Visit

## 2024-02-04 ENCOUNTER — Ambulatory Visit: Admission: EM | Admit: 2024-02-04 | Discharge: 2024-02-04 | Disposition: A | Discharge status: Home / Self Care

## 2024-02-04 DIAGNOSIS — K5900 Constipation, unspecified: Secondary | ICD-10-CM

## 2024-02-04 NOTE — Discharge Instructions (Addendum)
 It appears that you are constipated, xray shows that there is a large stool burden. Diclofenac can cause constipation.  We should get an 8oz bottle of Miralax and 64 oz of gatorade(don't get red). We need to drink 17g of Miralax dissolved in 4-8 oz of gatorade every 30mins until 64 oz bottle of gatorade is gone. If you start to experience nausea, stop drinking until nausea passes and then attempt to start drinking fluids again. This should produce a bowel movement for you with in 24 hrs, if it does not we should report to the ER. If you experience several abdominal pain, nausea, or vomiting we should report to the ER.

## 2024-02-04 NOTE — ED Triage Notes (Signed)
 Pt st's he has not had a BM in past 7 - 8 days  Pt denies any abd pain.  St's he has tried stool softeners without relief

## 2024-02-04 NOTE — ED Provider Notes (Signed)
 [facility]    CSN: [id number] Arrival date & time: 02/04/24  1424      History   Chief Complaint Chief Complaint  Patient presents with   Constipation    HPI Marcus Ramos is a 83 y.o. male.   Pt presents today due 7-8 days without having a bowel movement. Pt denies abdominal pain, nausea, vomiting, or changes in appetite. Pt states that he was seen on 10/6 for injury and was prescribed diclofenac sodium for pain. Pt has a hx of neuropathy in legs but denies saddle anesthesia or changes in bladder habits. Pt states that he is not experiencing the urge to have a bowel movement. Pt states that he has attempted to use stool softeners with no relief.    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 Staged drug eluting stenting of circumflex artery   Type II or unspecified type diabetes mellitus without mention of complication, not stated as uncontrolled     Patient Active Problem List   Diagnosis Date Noted   Encounter for therapeutic drug monitoring 06/21/2013   Long term (current) use of anticoagulants 07/19/2010   HYPERLIPIDEMIA-MIXED 02/21/2009   MURAL THROMBUS, LEFT VENTRICLE 02/21/2009   GOUT, UNSPECIFIED 12/07/2008   SYSTOLIC HEART FAILURE, CHRONIC 12/07/2008   CAD, NATIVE VESSEL 11/16/2008   Cerebral artery occlusion with cerebral infarction (HCC) 11/16/2008   DIABETES MELLITUS, TYPE II 11/12/2008   OBESITY 11/12/2008   ISCHEMIC CARDIOMYOPATHY 11/12/2008   History of cardiovascular disorder 11/12/2008   HYPOPOTASSEMIA 11/01/2008    Past Surgical History:  Procedure Laterality Date   UMBILICAL HERNIA REPAIR  1983       Home  Medications    Prior to Admission medications   Medication Sig Start Date End Date Taking? Authorizing Provider  acetaminophen (TYLENOL) 650 MG CR tablet Take 1,300 mg by mouth as needed for pain.    [provider]  allopurinol  (ZYLOPRIM ) 100 MG tablet Take 50 mg by mouth daily.    [provider]  atorvastatin  (LIPITOR) 40 MG tablet TAKE 1 TABLET DAILY 05/15/23   Bensimhon, Daniel R, MD  carvedilol  (COREG ) 6.25 MG tablet TAKE 1 TABLET TWICE A DAY 03/03/23   Bensimhon, Daniel R, MD  clotrimazole-betamethasone (LOTRISONE) cream SMARTSIG:sparingly Topical Every 12 Hours 01/06/24   [provider]  colchicine 0.6 MG tablet Take 0.6 mg by mouth as needed.   Patient not taking: Reported on 01/26/2024    [provider]  diclofenac (VOLTAREN) 50 MG EC tablet Take 1 tablet (50 mg total) by mouth 2 (two) times daily. 01/26/24   Reddick, Johnathan B, NP  empagliflozin  (JARDIANCE ) 10 MG TABS tablet Take 1 tablet (10 mg total) by mouth daily before breakfast. 11/27/23   Stoner, Benjamin J, MD  gabapentin (NEURONTIN) 800 MG tablet SMARTSIG:0.5 Tablet(s) By Mouth Every 12 Hours 09/10/21   [provider]  nitroGLYCERIN  (NITROSTAT ) 0.4 MG SL tablet Place 1 tablet (0.4 mg total) under the tongue every 5 (five) minutes as needed. 08/07/22   Bensimhon, Toribio SAUNDERS, MD  sacubitril -valsartan  (ENTRESTO ) 24-26 MG Take 1 tablet by mouth 2 (two) times daily. 11/27/23   Zenaida,  Morene PARAS, MD  spironolactone  (ALDACTONE ) 25 MG tablet TAKE 1 TABLET DAILY 05/15/23   Bensimhon, Daniel R, MD  warfarin (JANTOVEN ) 5 MG tablet TAKE 1 TABLET DAILY EXCEPT 1/2 TABLET ON SUNDAYS AND  THURSDAYS OR AS DIRECTED BYCOUMADIN CLINIC 08/13/23   Bensimhon, Toribio SAUNDERS, MD    Family History Family History  Problem Relation Age of Onset   Coronary artery disease Mother    Hypertension Mother    Coronary artery disease Father    Hypertension Father     Social History Social History   Tobacco Use    Smoking status: Never   Smokeless tobacco: Never  Vaping Use   Vaping status: Never Used  Substance Use Topics   Alcohol use: No   Drug use: Never     Allergies   Patient has no known allergies.   Review of Systems Review of Systems  Gastrointestinal:  Positive for constipation.     Physical Exam Triage Vital Signs ED Triage Vitals  Encounter Vitals Group     BP 02/04/24 1444 (!) 143/85     Girls Systolic BP Percentile --      Girls Diastolic BP Percentile --      Boys Systolic BP Percentile --      Boys Diastolic BP Percentile --      Pulse Rate 02/04/24 1444 76     Resp 02/04/24 1444 18     Temp 02/04/24 1444 97.8 F (36.6 C)     Temp Source 02/04/24 1444 Oral     SpO2 --      Weight 02/04/24 1445 205 lb (93 kg)     Height 02/04/24 1445 5' 11 (1.803 m)     Head Circumference --      Peak Flow --      Pain Score 02/04/24 1445 0     Pain Loc --      Pain Education --      Exclude from Growth Chart --    No data found.  Updated Vital Signs BP (!) 143/85 (BP Location: Right Arm)   Pulse 76   Temp 97.8 F (36.6 C) (Oral)   Resp 18   Ht 5' 11 (1.803 m)   Wt 205 lb (93 kg)   BMI 28.59 kg/m   Visual Acuity Right Eye Distance:   Left Eye Distance:   Bilateral Distance:    Right Eye Near:   Left Eye Near:    Bilateral Near:     Physical Exam   UC Treatments / Results  Labs (all labs ordered are listed, but only abnormal results are displayed) Labs Reviewed - No data to display  EKG   Radiology No results found.  Procedures Procedures (including critical care time)  Medications Ordered in UC Medications - No data to display  Initial Impression / Assessment and Plan / UC Course  I have reviewed the triage vital signs and the nursing notes.  Pertinent labs & imaging results that were available during my care of the patient were reviewed by me and considered in my medical decision making (see chart for details).     Constipation-It  appears that you are constipated, xray shows that there is a large stool burden. Diclofenac can cause constipation.  We should get an 8oz bottle of Miralax and 64 oz of gatorade(don't get red). We need to drink 17g of Miralax dissolved in 4-8 oz of gatorade every 30mins until 64 oz bottle of gatorade is gone. If you start to  experience nausea, stop drinking until nausea passes and then attempt to start drinking fluids again. This should produce a bowel movement for you with in 24 hrs, if it does not we should report to the ER. If you experience several abdominal pain, nausea, or vomiting we should report to the ER.  Final Clinical Impressions(s) / UC Diagnoses   Final diagnoses:  Constipation, unspecified constipation type   Discharge Instructions   None    ED Prescriptions   None    PDMP not reviewed this encounter.   Andra Corean BROCKS, PA-C 02/04/24 1600

## 2024-02-09 ENCOUNTER — Other Ambulatory Visit (HOSPITAL_COMMUNITY): Payer: Self-pay | Admitting: Internal Medicine

## 2024-02-09 DIAGNOSIS — Z7901 Long term (current) use of anticoagulants: Secondary | ICD-10-CM

## 2024-02-09 NOTE — Telephone Encounter (Signed)
 Refill request for warfarin:  Last INR was 2.6 on 01/23/24 Next INR due 03/05/24 LOV was 11/27/23  Refill approved.

## 2024-03-05 ENCOUNTER — Ambulatory Visit: Attending: Cardiovascular Disease | Admitting: Pharmacist

## 2024-03-05 DIAGNOSIS — Z7901 Long term (current) use of anticoagulants: Secondary | ICD-10-CM

## 2024-03-05 DIAGNOSIS — Z5181 Encounter for therapeutic drug level monitoring: Secondary | ICD-10-CM

## 2024-03-05 DIAGNOSIS — I6350 Cerebral infarction due to unspecified occlusion or stenosis of unspecified cerebral artery: Secondary | ICD-10-CM

## 2024-03-05 LAB — POCT INR: INR: 2.6 (ref 2.0–3.0)

## 2024-03-05 NOTE — Patient Instructions (Addendum)
 Description   INRn 2.6: Continue taking warfarin 1 tablet daily except 1/2 tablet on Sundays and Thursdays.  Recheck in 6 weeks.  Stay consistent with your greens.  Coumadin  Clinic [phone]

## 2024-03-05 NOTE — Progress Notes (Signed)
 Description   INRn 2.6: Continue taking warfarin 1 tablet daily except 1/2 tablet on Sundays and Thursdays.  Recheck in 6 weeks.  Stay consistent with your greens.  Coumadin  Clinic [phone]

## 2024-04-16 ENCOUNTER — Ambulatory Visit: Attending: Cardiovascular Disease

## 2024-04-16 DIAGNOSIS — Z7901 Long term (current) use of anticoagulants: Secondary | ICD-10-CM

## 2024-04-16 DIAGNOSIS — Z5181 Encounter for therapeutic drug level monitoring: Secondary | ICD-10-CM

## 2024-04-16 DIAGNOSIS — I6350 Cerebral infarction due to unspecified occlusion or stenosis of unspecified cerebral artery: Secondary | ICD-10-CM | POA: Diagnosis not present

## 2024-04-16 LAB — POCT INR: INR: 2.6 (ref 2.0–3.0)

## 2024-04-16 NOTE — Patient Instructions (Signed)
 Continue taking warfarin 1 tablet daily except 1/2 tablet on Sundays and Thursdays.  Recheck in 6 weeks.  Stay consistent with your greens.  Coumadin  Clinic [phone]

## 2024-04-16 NOTE — Progress Notes (Signed)
 INR 2.6 Please see anticoagulation encounter Continue taking warfarin 1 tablet daily except 1/2 tablet on Sundays and Thursdays.  Recheck in 6 weeks.  Stay consistent with your greens.  Coumadin  Clinic [phone]

## 2024-04-28 ENCOUNTER — Other Ambulatory Visit (HOSPITAL_COMMUNITY): Payer: Self-pay | Admitting: Internal Medicine

## 2024-04-28 DIAGNOSIS — I5022 Chronic systolic (congestive) heart failure: Secondary | ICD-10-CM

## 2024-05-11 ENCOUNTER — Other Ambulatory Visit (HOSPITAL_COMMUNITY): Payer: Self-pay

## 2024-05-11 ENCOUNTER — Telehealth (HOSPITAL_COMMUNITY): Payer: Self-pay

## 2024-05-11 NOTE — Telephone Encounter (Signed)
 Advanced Heart Failure Patient Advocate Encounter  Patient contacted office with concerns regarding Jardiance  copay. Patient uses copay card for $10 pricing. Unable to register patient for a new card, as there is an active card available. Contacted pharmacy to confirm billing, pharmacist did confirm that insurance and copay card have been applied for $135 current price. Patient may have a deductible or higher copays for 2026, as copay card covers a max of $175 per month.  Explained to patient, he may be able to contact insurance to confirm copay / deductible information to clarify. Patient will reach out to me for further assistance if this issue persists into 2026.  Rachel DEL, CPhT Rx Patient Advocate Phone: [phone]

## 2024-05-26 NOTE — Progress Notes (Incomplete)
" ° °  ADVANCED HEART FAILURE FOLLOW UP CLINIC NOTE  Referring Physician: Loring Tanda Mae, MD  Primary Care: Loring Tanda Mae, MD Primary Cardiologist:  HPI: Marcus Ramos is a 84 y.o. male with a PMH of DM2, HTN, CAD and chronic systolic HF who presents for follow up of chronic systolic HF.      He suffered a large anterolateral MI in 6/10 c/b cardiogenic shock. He was treated with emergent bare-metal stenting of the LAD, as well as a  drug-eluting stent to the circumflex. S/P PTCA RCA. Ejection fraction was 25% with global hypokinesis, and question of a left ventricular thrombus. He was placed on Coumadin  as well as aspirin  and Plavix. 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   Had echo 8/10 which confirmed EF 25-30%. MRI 2010 EF 23%. With full thickness scar. No signifcant viability.   We previously arranged for him to see Dr. Kelsie to discuss ICD placement. He saw Dr. Kelsie and was set-up for an ICD but he cancelled his implant appt saying he didn't want a device at that time.   ECHO 05/15/16 EF 20-25% apical AK RV ok.      SUBJECTIVE:    PMH, current medications, allergies, social history, and family history reviewed in epic.  PHYSICAL EXAM: There were no vitals filed for this visit.  GENERAL: NAD, well appearing PULM:  Normal work of breathing, CTAB CARDIAC:  JVP: flat         Normal rate with regular rhythm. No murmurs, rubs or gallops.  No edema. Warm and well perfused extremities. ABDOMEN: Soft, non-tender, non-distended. NEUROLOGIC: Patient is oriented x3 with no focal or lateralizing neurologic deficits.     DATA REVIEW  ASSESSMENT & PLAN:  Chronic systolic HF: Related to prior anterior infarct, last ejection fraction 20 to 25% with normal RV.  No change in his symptoms, stalbe NYHA class II-III. Worsening BP and having to cut his entresto  dose more. - Decrease entresto  to 24/26mg  BID -  Continue carvedilol  6.25mg  BID - Continue spironolactone  25mg  daily - Continue jardiance  10mg  daily - He has seen EP in past and he doesn't want ICD -Discussed Barostim with Dr. Cherrie, declined  HLD: Joint stiffness improved with Tylenol and occasional gabapentin.  Discussed that he could cut back on statin if worsening, no change since last visit -Continue Lipitor 40 mg daily  Mural thrombus:  -Continue home Coumadin   CAD: Large anterior MI in 2010, very rare angina that improves with rest, no nitroglycerin  use. -Continue warfarin, statin  DM2: -Continue Jardiance   Follow up in 6 months  Morene Brownie, MD Advanced Heart Failure Mechanical Circulatory Support 05/27/24 "

## 2024-05-27 ENCOUNTER — Ambulatory Visit (HOSPITAL_COMMUNITY)
Admission: RE | Admit: 2024-05-27 | Discharge: 2024-05-27 | Disposition: A | Source: Ambulatory Visit | Attending: Cardiology | Admitting: Cardiology

## 2024-05-27 ENCOUNTER — Encounter (HOSPITAL_COMMUNITY): Payer: Self-pay | Admitting: Cardiology

## 2024-05-27 VITALS — BP 106/60 | HR 70 | Wt 197.8 lb

## 2024-05-27 DIAGNOSIS — I739 Peripheral vascular disease, unspecified: Secondary | ICD-10-CM | POA: Insufficient documentation

## 2024-05-27 NOTE — Patient Instructions (Signed)
 There has been no changes to your medications.  Your provider has ordered ABI's. You will be called to have this test arranged.  Your physician recommends that you schedule a follow-up appointment in: 6 months ( August) ** PLEASE CALL THE OFFICE IN JUNE TO ARRANGE YOUR FOLLOW UP APPOINTMENT.**  If you have any questions or concerns before your next appointment please send us  a message through [facility] or call our office at [phone].    TO LEAVE A MESSAGE FOR THE NURSE SELECT OPTION 2, PLEASE LEAVE A MESSAGE INCLUDING: YOUR NAME DATE OF BIRTH CALL BACK NUMBER REASON FOR CALL**this is important as we prioritize the call backs  YOU WILL RECEIVE A CALL BACK THE SAME DAY AS LONG AS YOU CALL BEFORE 4:00 PM  At the [facility], you and your health needs are our priority. As part of our continuing mission to provide you with exceptional heart care, we have created designated Provider Care Teams. These Care Teams include your primary Cardiologist (physician) and Advanced Practice Providers (APPs- Physician Assistants and Nurse Practitioners) who all work together to provide you with the care you need, when you need it.   You may see any of the following providers on your designated Care Team at your next follow up: Dr Toribio Fuel Dr Ezra Shuck Dr. Morene Brownie Greig Mosses, NP Caffie Shed, [place] [facility] [place], [place] Beckey Coe, NP Jordan Lee, NP Ellouise Class, NP Tinnie Redman, PharmD Jaun Bash, PharmD   Please be sure to bring in all your medications bottles to every appointment.    Thank you for choosing [facility]

## 2024-05-28 ENCOUNTER — Ambulatory Visit

## 2024-05-28 DIAGNOSIS — Z7901 Long term (current) use of anticoagulants: Secondary | ICD-10-CM

## 2024-05-28 DIAGNOSIS — I6350 Cerebral infarction due to unspecified occlusion or stenosis of unspecified cerebral artery: Secondary | ICD-10-CM

## 2024-05-28 DIAGNOSIS — Z5181 Encounter for therapeutic drug level monitoring: Secondary | ICD-10-CM

## 2024-05-28 LAB — POCT INR: INR: 2.2 (ref 2.0–3.0)

## 2024-05-28 NOTE — Progress Notes (Signed)
 INR  2.2  Continue taking warfarin 1 tablet daily except 1/2 tablet on Sundays and Thursdays.  Recheck in 6 weeks.  Stay consistent with your greens.  Coumadin  Clinic [phone]

## 2024-05-28 NOTE — Patient Instructions (Signed)
 Continue taking warfarin 1 tablet daily except 1/2 tablet on Sundays and Thursdays.  Recheck in 6 weeks.  Stay consistent with your greens.  Coumadin  Clinic [phone]

## 2024-07-08 ENCOUNTER — Ambulatory Visit (HOSPITAL_COMMUNITY)

## 2024-07-09 ENCOUNTER — Ambulatory Visit
